# Patient Record
Sex: Female | Born: 1955 | Race: White | Hispanic: No | Marital: Married | Smoking: Never smoker
Health system: Southern US, Community
[De-identification: ages and names within clinical notes are randomized; demographics above are authoritative.]

## PROBLEM LIST (undated history)

## (undated) DIAGNOSIS — T783XXA Angioneurotic edema, initial encounter: Secondary | ICD-10-CM

## (undated) DIAGNOSIS — K900 Celiac disease: Secondary | ICD-10-CM

## (undated) DIAGNOSIS — K219 Gastro-esophageal reflux disease without esophagitis: Secondary | ICD-10-CM

## (undated) HISTORY — PX: ABDOMINAL HYSTERECTOMY: SHX81

## (undated) HISTORY — DX: Angioneurotic edema, initial encounter: T78.3XXA

---

## 1998-12-11 ENCOUNTER — Other Ambulatory Visit: Admission: RE | Admit: 1998-12-11 | Discharge: 1998-12-11 | Payer: Self-pay | Admitting: Gynecology

## 2000-03-16 ENCOUNTER — Other Ambulatory Visit: Admission: RE | Admit: 2000-03-16 | Discharge: 2000-03-16 | Payer: Self-pay | Admitting: Gynecology

## 2000-09-12 ENCOUNTER — Inpatient Hospital Stay (HOSPITAL_COMMUNITY): Admission: RE | Admit: 2000-09-12 | Discharge: 2000-09-14 | Payer: Self-pay | Admitting: Gynecology

## 2000-09-12 ENCOUNTER — Encounter (INDEPENDENT_AMBULATORY_CARE_PROVIDER_SITE_OTHER): Payer: Self-pay | Admitting: Specialist

## 2001-10-12 ENCOUNTER — Other Ambulatory Visit: Admission: RE | Admit: 2001-10-12 | Discharge: 2001-10-12 | Payer: Self-pay | Admitting: Gynecology

## 2004-04-15 ENCOUNTER — Other Ambulatory Visit: Admission: RE | Admit: 2004-04-15 | Discharge: 2004-04-15 | Payer: Self-pay | Admitting: Obstetrics and Gynecology

## 2006-01-05 ENCOUNTER — Ambulatory Visit (HOSPITAL_COMMUNITY): Admission: RE | Admit: 2006-01-05 | Discharge: 2006-01-05 | Payer: Self-pay | Admitting: Family Medicine

## 2006-03-16 ENCOUNTER — Emergency Department (HOSPITAL_COMMUNITY): Admission: EM | Admit: 2006-03-16 | Discharge: 2006-03-16 | Payer: Self-pay | Admitting: Emergency Medicine

## 2006-03-29 ENCOUNTER — Encounter (HOSPITAL_COMMUNITY): Admission: RE | Admit: 2006-03-29 | Discharge: 2006-04-03 | Payer: Self-pay | Admitting: Family Medicine

## 2006-03-31 ENCOUNTER — Ambulatory Visit: Payer: Self-pay | Admitting: Gastroenterology

## 2008-09-03 ENCOUNTER — Ambulatory Visit (HOSPITAL_COMMUNITY): Admission: RE | Admit: 2008-09-03 | Discharge: 2008-09-03 | Payer: Self-pay | Admitting: Family Medicine

## 2010-04-24 ENCOUNTER — Encounter: Payer: Self-pay | Admitting: Family Medicine

## 2010-05-04 ENCOUNTER — Other Ambulatory Visit: Payer: Self-pay | Admitting: Obstetrics and Gynecology

## 2010-05-04 DIAGNOSIS — R928 Other abnormal and inconclusive findings on diagnostic imaging of breast: Secondary | ICD-10-CM

## 2010-05-05 ENCOUNTER — Encounter: Payer: Self-pay | Admitting: Obstetrics and Gynecology

## 2010-05-11 ENCOUNTER — Ambulatory Visit
Admission: RE | Admit: 2010-05-11 | Discharge: 2010-05-11 | Disposition: A | Discharge status: Home / Self Care | Payer: BC Managed Care – PPO | Source: Ambulatory Visit | Attending: Obstetrics and Gynecology | Admitting: Obstetrics and Gynecology

## 2010-05-11 DIAGNOSIS — R928 Other abnormal and inconclusive findings on diagnostic imaging of breast: Secondary | ICD-10-CM

## 2010-08-20 NOTE — Consult Note (Signed)
NAME:  Rhonda Sullivan                ACCOUNT NO.:  [id number]   MEDICAL RECORD NO.:  [id number]           PATIENT TYPE:   LOCATION:                                FACILITY:  [facility]   PHYSICIAN:  Caro Hight, M.D.      DATE OF BIRTH:  Jul 02, 1955   DATE OF CONSULTATION:  03/31/2006  DATE OF DISCHARGE:                                 CONSULTATION   REASON FOR CONSULTATION:  Reflux and abdominal pain.   HPI:  Rhonda Sullivan is a 55 year old female who reports having bad  indigestion day and night that began approximately two months ago.  She  states that two weeks ago, she ate a sandwich from [employer] and she  was doubled over in pain and even ice water caused her to have abdominal  pain and indigestion.  She saw her doctor because of chest pain, as  well.  She was also seen and evaluated in the emergency department.  She  reports having a CT scan, an echocardiogram, and a stress test which was  okay.  She reports seeing Dr. Leslye Peer who told her she may have  peptic ulcer disease and may need an upper endoscopy.  I have no records  available to me today.  She was started on over the counter Prilosec  once a day which she took for several days in a row and her symptoms are  better.  She was wondering if this was related to stress from  volunteering at school.  She also complains of epigastric pain.  Her  weight is increased by approximately 30 pounds in the last year.  She  has gained 10 pounds in the last six months.  She also complains of  increasing pain in her upper extremities over the last six months.  She  started out with pain behind her left knee.  She now has so much pain in  her left leg and in her right leg when she bends over that she is unable  to clip her toenails.  She also has pain in her upper arms and in her  chest, as well.  She also has the inability to open jars and this is  new.  Her husband is unable to lay his arm across her chest because of  pain and it is too  uncomfortable.  She is still able to walk.  She is  still able to get out of a chair.  She denies any difficulty swallowing.  She does not use aspirin, BC, or Goody's.  She rarely uses Aleve or  ibuprofen.  She denies any blood in her stool or black stool.  She had  nausea with pain in her chest which is now resolved.  She denies any  vomiting.  She had a partial hysterectomy and because of that,  occasionally she has episodes in which she has to wear pads in her  underwear due to fecal seepage and having small bowel movements.  During  these episodes, she has no awareness that she is having a bowel  movement.  She has never  had a colonoscopy.   PAST MEDICAL HISTORY:  Per the HPI.   PAST SURGICAL HISTORY:  Per the HPI.   ALLERGIES:  No known drug allergies.   MEDICATIONS:  1. Omeprazole 20 mg a day as needed.  2. Tums 2 day as needed.   FAMILY HISTORY:  Her maternal grandmother had ovarian cancer.  She has  no family history of breast or ovarian cancer, colon cancer or colon  polyps.   SOCIAL HISTORY:  She is married and has three children.  She is a full  time Psychologist, occupational.  She denies any tobacco or alcohol use.   REVIEW OF SYSTEMS:  Per the HPI, otherwise, all systems are negative.   PHYSICAL EXAMINATION:  VITAL SIGNS:  Weight 160 pounds, height 5 feet 3  inches, BMI 28.3 (overweight) temperature 98.2, blood pressure 122/68,  pulse 78.  GENERAL:  She is in no apparent distress, alert and oriented  x4.  HEENT:  Atraumatic, normocephalic.  Pupils equal and react to  light.  Mouth no oral lesions.  Posterior pharynx without erythema or  exudate.  NECK:  Full range of motion and no lymphadenopathy.  LUNGS:  Clear to auscultation bilaterally.  CARDIOVASCULAR:  Shows regular  rhythm, no murmur, normal S1 and S2.  ABDOMEN:  Bowel sounds are  present, soft, nontender, nondistended.  No rebound or guarding. No  splenomegaly.  No abdominal bruits.  No pulsatile masses.  EXTREMITIES:   Without cyanosis, clubbing or edema.  NEUROLOGICAL:  She has no focal neurologic deficits on gross  examination.   LABORATORY DATA:  In December 2007, white count 8.1, hemoglobin 15.2,  platelets 251, BUN 21, creatinine 0.87, total bili 0.6, AST 36, ALT 43,  albumin 4.3, amylase 27, lipase 17.   ASSESSMENT:  Rhonda Sullivan is a 55 year old female with his heartburn,  indigestion, and chest pain which is likely secondary to  gastroesophageal reflux disease.  She has had symptomatic improvement on  over the counter Prilosec once a day.  She is now only using Prilosec  periodically.  She may have gastroesophageal reflux disease as a primary  diagnosis.  With her the profound myalgias and upper extremity weakness,  in the differential diagnosis would include an esophageal motility  disorder secondary to a connective tissue disorder presenting with  gastroesophageal reflux disease as a symptom.  She is average risk for  developing colon cancer.  Thank you for allowing me to see Rhonda Sullivan in  consultation.  My recommendations follow.   RECOMMENDATIONS:  1. I recommend Rhonda Sullivan see Dr. Berna Spare or Donnal Moat, P.A.-C.,      for her upper and lower extremity muscle pain and upper extremity      weakness and be evaluated as soon as possible.  I spoke with Ms.      Sullivan and explained this to her.  I also spoke with Donnal Moat,      P.A.-C., today.  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  2. She should continue the Prilosec twice daily for two weeks and then      once daily until her weight is 130 to 140 pounds.  She is given the      [facility] GERD self-care handout and asked to follow the      recommendations.  3. If her  chest pain and nausea are not completely resolved in one     month, then I will schedule her endoscopy      prior to March 2008.   Otherwise, her an EGD and colonoscopy will be      scheduled for [id number].  If her symptoms are completely      resolved by March 2008, then only colonoscopy will be performed.  A      follow-up appointment will be scheduled after her endoscopies have      been completed.      Caro Hight, M.D.  Electronically Signed     SM/MEDQ  D:  03/31/2006  T:  03/31/2006  Job:  [id number]   cc:   Donnal Moat, PA   Bonne Dolores, M.D.  Fax: [phone]   Leslye Peer, MD  Fax: [phone]

## 2010-08-20 NOTE — Op Note (Signed)
[facility]  Patient:    Rhonda Sullivan, Rhonda Sullivan                    MRN: [id number] Proc. Date: 09/12/00 Adm. Date:  [id number] Attending:  Oswaldo Done                           Operative Report  PREOPERATIVE DIAGNOSES: 1. Pelvic organ prolapse with grade 3 cystocele, enterocele, rectocele and    grade 3 uterine descensus. 2. Stress urinary incontinence.  POSTOPERATIVE DIAGNOSES: 1. Pelvic organ prolapse with grade 3 cystocele, enterocele, rectocele and    grade 3 uterine descensus. 2. Stress urinary incontinence.  PROCEDURES: 1. Abdominal hysterectomy. 2. Cardinal uterosacral complex vaginal vault suspension. 3. Halban culdoplasty. 4. Paravaginal repair. 5. Burch procedure. 6. Posterior and enterocele repairs.  SURGEON:  Selinda Orion, M.D.  ASSISTANT:  Dallie Dad, M.D.  ANESTHESIA:  Orotracheal.  DESCRIPTION OF PROCEDURE:  Under excellent general anesthesia with the patient prepped and draped in the [place] with a three-way 15 cc Foley in her bladder, a Pfannenstiel incision was made and extended through the fascia. The peritoneum was then opened and the abdomen explored.  There were no abnormalities identified in the upper abdomen.  Both ovaries appeared normal. The uterus was elevated by Allis clamps placed across the adnexal pedicles. The round ligament was then sutured and ligated then transected.  The anterior leaf of the broad ligament was then opened so as to push the peritoneum and bladder off the lower uterine segment.  The ovarian vessels were then skeletonized, clamped, cut, sutured and tied with 0 Vicryl.  A second free tie was used to doubly ligated the ovarian ligament.  At this point, the uterine vessels were skeletonized, clamped, cut, sutured and tied with 0 Vicryl.  The cardinal and uterosacrals were then progressively clamped, cut, sutured and tied with 0 Vicryl.  At this point, the cervix was excised and  the vaginal cuff closed with a running suture of 0 Vicryl.  At this point, attention was turned to the central fascial defect at the apex of the vaginal cuff.  The defect was identified and the bladder dissected off the defect.  The defect was then plicated with a running suture of 2-0 Vicryl.  At this point, the peritoneum was removed from the cul-de-sac so as to remove the enterocele sac. At this point, a Halban culdoplasty was performed so as to secure the perirectal fascia to the vaginal apex at the cuff with a series of interrupted sutures.  The transverse fascial defect of the perirectal fascia was thus approximated to the cuff to provide a complete fascial envelope.  The cardinal-uterosacral suspension or complex suspension sutures were then placed from posterior uterosacral ligament to vaginal cuff including the uterosacral and cardinal ligaments and then secured to the anterior vaginal fascia.  The cuff was thus secured by cardinal-uterosacral complex culposuspension.  The material used was 0 Ethibond.  At this point, the pelvic floor was reperitonealized with a running suture of 2-0 Monocryl.  The abdominal peritoneum was then closed with a running suture of 0 Monocryl.  At this point, attention was turned to the paravaginal portion of the procedure.  The retropubic space was dissected and the adventitial tissue cleared as well as so as to expose the white line fascia of the pelvis and the area of separation of the paracervical tissues.  At this point, a key  suture was placed lateral to the vesical neck so as to elevate the urethra underneath the symphysis. The paravaginal defect was then corrected with interrupted suture of 0 Ethibond approximately 1 cm apart from the ischial spine to the retropubic attachments.  At this point, Burch sutures were placed on each side so as to elevate the entire anterior vaginal wall into a high intra-abdominal position. Three sutures were placed  on the right and two on the left.  Again, 0 Ethibond was used.  At this point, with the cystocele completely reduced and the tissue secured to the areas from which it had been separated, the procedure was terminated and the pelvis irrigated.  The rectus muscles were then plicated in the midline with a running suture of 0 Monocryl.  The fascia was then approximated with interrupted sutures of 0 Vicryl from each angle to the midline.  The subcutaneous tissues were approximated with interrupted sutures of 3-0 Vicryl and the skin closed with skin staples and Steri-Strips.  At this point, attention was turned to the posterior repair and enterocele repair. The patient was reprepped and draped for the vaginal portion of the procedure.  At this point, the posterior vaginal mucosa was incised and dissected from the perirectal fascia so as to completely expose the perirectal fascial defects.  The transverse fascial tear had been previously repaired from above.  The central fenestration and weakness of the fascia was then plicated with a running suture of 2-0 Vicryl.  The detachment of the  perineal body from the levator group was then corrected with interrupted sutures of 2-0 Vicryl. Next, the mucosa was trimmed and the upper layers of endopelvic fascia closed as a single running suture from the apex of the cuff to the introitus.  Once the repairs were complete, the bladder was filled with approximately 400 cc of lactate Ringers and a Bonnano suprapubic cystocath inserted and secured with 0 Ethibond.  At the end of the procedure, sponge and lap counts were correct. There were no complications.  The patient returned to the recovery room in excellent condition. DD:  09/12/00 TD:  09/13/00 Job: [id number]

## 2010-08-20 NOTE — H&P (Signed)
[facility]  Patient:    Rhonda Sullivan, Rhonda Sullivan                      MRN: [id number] Adm. Date:  09/11/00 Attending:  Selinda Orion, M.D. CC:         Dr. Rexene Alberts, N.C.   History and Physical  CHIEF COMPLAINT:  Pelvic support problems with incontinence.  HISTORY OF PRESENT ILLNESS:  A 55 year old white married, gravida 4, para 3, AB 1 with a history of very difficult third delivery now with progressive difficulty with urinary incontinence and bulge of her vaginal wall through the introitus. She only occasionally wears pads but often has to change her clothing. The bulge of her vaginal wall through the introitus has become more pronounced in the last year or so. She also has discomfort when she stands for a prolonged period of time. She also has recently had a low grade abnormal genital cytology resolved on most recent Pap after colpo directed biopsies. She has had workup that revealed a very large bladder capacity but adequate emptying. She has a markedly positive Q-tip test with a rotation of 90 degrees. Post void she has evidence of central fascial weakness. She also has bilateral paravaginal defects. She has central and transverse posterior vaginal wall defects with enterocele and rectocele. Her anus and ______, but she has significant detachment of the perineal body. I have discussed with her alternative forms of therapy including urologic procedures, GYN procedures, no therapy, and pessary. She understands all and is now admitted for definitive therapy by abdominal hysterectomy, paravaginal plus Burch, posterior and enterocele repairs and vaginal vault colposuspension.  PAST MEDICAL HISTORY:  Usual childhood disease without sequelae.  MEDICAL ILLNESSES:  Chronic constipation and GERD. She also has a history of uterine leiomyomata without significant increase in size over the years. Previous hospitalizations for reflux in 1973 and for  vaginal delivery x 3.  FAMILY HISTORY:  Father died age 52 of emphysema, cardiovascular disease, and diabetes. Mother has significant arthritis and GI problems. Sister and three brothers living and well.  HABITS:  Denies ethanol or tobacco use.  SOCIAL HISTORY:  A Pharmacist, hospital in [place], [place]. Her children are grown, her youngest still living at home.  REVIEW OF SYSTEMS:  HEENT:  Denies symptoms. CARDIORESPIRATORY:  Denies asthma, cough, bronchitis, shortness of breath. GI/GU: Denies frequency, urgency, dysuria, change in bowel habits or food intolerance. Urologic problems as above. Pelvic support problems as outlined.  PHYSICAL EXAMINATION:  GENERAL:  Well-developed, well-nourished, white female, moderately over ideal weight.  HEENT:  Pupils equal round and reactive to light and accommodate. Fundi not examined. Oropharynx clear.  NECK:  Supple otherwise mass or thyroid enlargement.  BREASTS:  Soft without mass, nodes, nipple discharge.  HEART:  Regular rhythm without murmur or cardiac enlargement.  ABDOMEN:  Soft, scaphoid without mass or organomegaly.  PELVIC:  External genitalia normal female. Vagina poor anterior vaginal wall support with bulge through the introitus with straining.  GI:  Bilateral paravaginal defects and central fascial defect with smooth mucosa at the apex of the cuff. She has uterine descensus grade 2 and extremely poor posterior pelvic support with central and transverse fascial weakness. She has perineal body detachment. Her uterus is approximately eight week size, adnexa clear. Rectovaginal confirms.  EXTREMITIES:  Negative.  NEUROLOGIC:  Physiologic.  IMPRESSION:  Pelvic support problems with central and paravaginal defects, grade 1-2 uterine descensus, rectocele and enterocele with detachment of the perineal body.  PLAN:  Abdominal hysterectomy paravaginal plus Burch, colposuspension, posterior and enterocele repairs. DD:   09/11/00 TD:  09/12/00 Job: [id number]

## 2010-08-20 NOTE — Discharge Summary (Signed)
[facility]  Patient:    Rhonda Sullivan, Rhonda Sullivan                  MRN: [id number] Adm. Date:  09/12/00 Disc. Date: 09/14/00 Attending:  Selinda Orion, M.D.                           Discharge Summary  HISTORY OF PRESENT ILLNESS:  Ms. Nierman is a 55 year old white married female, gravida 36, para 3, AB 1, who presents to the office with difficulty of urinary incontinence and bulge of her vaginal wall through the introitus.  It is noted that she had a very difficult third delivery.  She occasionally wears pads but often has to change clothing.  The bulge at the vaginal wall comes through the introitus and has become more pronounced over the last year.  She also has discomfort when she stands for long periods of time.  It is noted that she recently had a low-grade abnormal genital cytology resolved on most recent Paps after colposcopy-directed biopsies.  Workup has revealed a very large bladder capacity but adequate emptying.  She has a markedly positive Q-tip test with rotation of 90 degrees.  Post void, she has evidence of central fascial weakness.  There are also bilateral paravaginal defects.  She has central and transverse posterior vaginal wall defects with enterocele and rectocele.  She has significant detachment of the peritoneal body.  She is now admitted for definitive therapy.  ADMISSION EXAM:  CHEST:  Clear to A&P.  HEART:  Rate and rhythm regular without murmur, gallop, or cardiac enlargement.  ABDOMEN:  Soft and scaphoid without masses or organomegaly.  PELVIC:  External genitalia within normal limits for female.  Vagina:  Poor anterior vaginal wall support with bulge through the introitus with straining. She has bilateral paravaginal defects and central fascial defects with smooth muscle at the apex of the cuff.  There was uterine descensus, grade 2, and extremely poor posterior pelvic support with central and transverse  fascial weakness.  There is detachment of the perineal body.  The uterus is approximately 8 weeks size, adnexa bilaterally clear.  Rectovaginal exam confirms.  IMPRESSION:  Pelvic support problems with central and paravaginal defects, grade 1 to 2, uterine descensus, rectocele and enterocele with detachment of the perineal body.  PLAN:  Abdominal hysterectomy, paravaginal, plus Birch procedure, colposuspension, posterior and enterocele repairs under general anesthesia. Risks and benefits were discussed with the patient and she accepts these procedures.  LABORATORY DATA:  Admission hemoglobin 14.6, hematocrit 43.5.  On the first postoperative day, hemoglobin was 11.1, hematocrit 42.9.  PROCEDURES PERFORMED: 1. Abdominal hysterectomy. 2. Cardinal uterosacral complex vaginal vault suspension. 3. Halban culdoplasty. 4. Paravaginal repair. 5. Wendee Copp procedure. 6. Posterior and enterocele repairs under general anesthesia.  HOSPITAL COURSE:  The procedures were completed without any complications and the patient was returned to the recovery room in excellent condition. Pathology report revealed simple hyperplasia without atypia of the endometrium, adenomyosis, and leiomyomata intramural.  Her postoperative course was without complications and she was discharged on the second postoperative day in excellent condition.  Final discharge instructions included no heavy lifting or straining, no vaginal entrance, and increased ambulation as tolerated.  She is to call for any fever of over 100.5 or failure of daily improvement.  Diet regular.  MEDICATIONS: 1. Bextra 20 mg one p.o. daily. 2. Tylox one p.o. q.2-4h. p.r.n. discomfort.  She is also to resume medications  taken prior to surgery as prescribed.  FOLLOW-UP:  She is to return to the office in one week for follow-up.  CONDITION ON DISCHARGE:  Excellent.  FINAL DISCHARGE DIAGNOSES: 1. Pelvic organ prolapse with grade 3 cystocele,  enterocele, rectocele, and    grade 3 uterine descensus. 2. Stress urinary incontinence.  PROCEDURES PERFORMED: 1. Abdominal hysterectomy. 2. Cardinal uterosacral complex vaginal vault suspension. 3. Halban coldoplasty. 4. Paravaginal repair. 5. Wendee Copp procedure. 6. Posterior and enterocele repairs under general anesthesia. DD:  09/22/00 TD:  09/24/00 Job: [id number]

## 2016-02-28 ENCOUNTER — Encounter (HOSPITAL_COMMUNITY): Payer: Self-pay | Admitting: *Deleted

## 2016-02-28 ENCOUNTER — Emergency Department (HOSPITAL_COMMUNITY)
Admit: 2016-02-28 | Discharge: 2016-02-28 | Disposition: A | Discharge status: Home / Self Care | Payer: BLUE CROSS/BLUE SHIELD | Attending: Emergency Medicine | Admitting: Emergency Medicine

## 2016-02-28 ENCOUNTER — Emergency Department (HOSPITAL_COMMUNITY)
Admission: EM | Admit: 2016-02-28 | Discharge: 2016-02-28 | Disposition: A | Discharge status: Home / Self Care | Payer: BLUE CROSS/BLUE SHIELD | Attending: Emergency Medicine | Admitting: Emergency Medicine

## 2016-02-28 DIAGNOSIS — R110 Nausea: Secondary | ICD-10-CM | POA: Diagnosis not present

## 2016-02-28 DIAGNOSIS — R109 Unspecified abdominal pain: Secondary | ICD-10-CM

## 2016-02-28 DIAGNOSIS — R1011 Right upper quadrant pain: Secondary | ICD-10-CM | POA: Insufficient documentation

## 2016-02-28 LAB — CBC WITH DIFFERENTIAL/PLATELET
Basophils Absolute: 0 10*3/uL (ref 0.0–0.1)
Basophils Relative: 1 %
EOS ABS: 0.5 10*3/uL (ref 0.0–0.7)
EOS PCT: 7 %
HCT: 44.4 % (ref 36.0–46.0)
Hemoglobin: 15 g/dL (ref 12.0–15.0)
LYMPHS ABS: 1.9 10*3/uL (ref 0.7–4.0)
LYMPHS PCT: 26 %
MCH: 29.6 pg (ref 26.0–34.0)
MCHC: 33.8 g/dL (ref 30.0–36.0)
MCV: 87.7 fL (ref 78.0–100.0)
MONO ABS: 0.8 10*3/uL (ref 0.1–1.0)
MONOS PCT: 11 %
Neutro Abs: 4.1 10*3/uL (ref 1.7–7.7)
Neutrophils Relative %: 55 %
PLATELETS: 189 10*3/uL (ref 150–400)
RBC: 5.06 MIL/uL (ref 3.87–5.11)
RDW: 14.5 % (ref 11.5–15.5)
WBC: 7.4 10*3/uL (ref 4.0–10.5)

## 2016-02-28 LAB — URINALYSIS, ROUTINE W REFLEX MICROSCOPIC
BILIRUBIN URINE: NEGATIVE
GLUCOSE, UA: NEGATIVE mg/dL
HGB URINE DIPSTICK: NEGATIVE
KETONES UR: 40 mg/dL — AB
Nitrite: NEGATIVE
PH: 6 (ref 5.0–8.0)
PROTEIN: NEGATIVE mg/dL
Specific Gravity, Urine: 1.01 (ref 1.005–1.030)

## 2016-02-28 LAB — URINE MICROSCOPIC-ADD ON
Bacteria, UA: NONE SEEN
RBC / HPF: NONE SEEN RBC/hpf (ref 0–5)

## 2016-02-28 LAB — COMPREHENSIVE METABOLIC PANEL
ALBUMIN: 4.3 g/dL (ref 3.5–5.0)
ALT: 34 U/L (ref 14–54)
AST: 38 U/L (ref 15–41)
Alkaline Phosphatase: 47 U/L (ref 38–126)
Anion gap: 10 (ref 5–15)
BUN: 16 mg/dL (ref 6–20)
CHLORIDE: 108 mmol/L (ref 101–111)
CO2: 22 mmol/L (ref 22–32)
CREATININE: 0.77 mg/dL (ref 0.44–1.00)
Calcium: 10 mg/dL (ref 8.9–10.3)
GFR calc Af Amer: >60 mL/min (ref >60)
GLUCOSE: 109 mg/dL — AB (ref 65–99)
POTASSIUM: 3.3 mmol/L — AB (ref 3.5–5.1)
Sodium: 140 mmol/L (ref 135–145)
Total Bilirubin: 0.8 mg/dL (ref 0.3–1.2)
Total Protein: 6.8 g/dL (ref 6.5–8.1)

## 2016-02-28 LAB — LIPASE, BLOOD: LIPASE: 26 U/L (ref 11–51)

## 2016-02-28 MED ORDER — SODIUM CHLORIDE 0.9 % IV BOLUS (SEPSIS)
1000.0000 mL | Freq: Once | INTRAVENOUS | Status: AC
Start: 2016-02-28 — End: 2016-02-28
  Administered 2016-02-28: 1000 mL via INTRAVENOUS

## 2016-02-28 MED ORDER — FAMOTIDINE 20 MG PO TABS: 20.0000 mg | ORAL_TABLET | Freq: Two times a day (BID) | ORAL | 0 refills | Status: DC

## 2016-02-28 MED ORDER — FENTANYL CITRATE (PF) 100 MCG/2ML IJ SOLN
50.0000 ug | Freq: Once | INTRAMUSCULAR | Status: AC
  Administered 2016-02-28: 50 ug via INTRAVENOUS
  Filled 2016-02-28: qty 2

## 2016-02-28 MED ORDER — ONDANSETRON HCL 4 MG PO TABS: 4.0000 mg | ORAL_TABLET | Freq: Three times a day (TID) | ORAL | 0 refills | Status: DC | PRN

## 2016-02-28 MED ORDER — ONDANSETRON HCL 4 MG/2ML IJ SOLN
4.0000 mg | Freq: Once | INTRAMUSCULAR | Status: AC
  Administered 2016-02-28: 4 mg via INTRAVENOUS
  Filled 2016-02-28: qty 2

## 2016-02-28 MED ORDER — HYDROCODONE-ACETAMINOPHEN 5-325 MG PO TABS: ORAL_TABLET | ORAL | 0 refills | Status: DC

## 2016-02-28 NOTE — ED Provider Notes (Signed)
[facility] DEPT Provider Note   CSN: [id number] Arrival date & time: 02/28/16  0232     History   Chief Complaint Chief Complaint  Patient presents with  . Abdominal Pain    HPI Rhonda Sullivan is a 60 y.o. female.  The history is provided by the patient and the spouse.  Abdominal Pain   This is a new problem. The current episode started yesterday. The problem has been gradually worsening. The pain is located in the RUQ. The pain is severe. Associated symptoms include nausea. Pertinent negatives include fever, vomiting and dysuria. The symptoms are aggravated by certain positions and palpation. Nothing relieves the symptoms.  patient reports several days ago she began having pain in back/scapular region of unknown etiology Then, yesterday she had episodes of RUQ abd pain with some radiation into her back.  She has nausea.  The pain is intermittent and severe at times She has never had this before No CP reported    PMH - none Soc hx - no ETOH use Surg hx - none OB History    No data available       Home Medications    Prior to Admission medications   Not on File    Family History No family history on file.  Social History Social History  Substance Use Topics  . Smoking status: Never Smoker  . Smokeless tobacco: Never Used  . Alcohol use No     Allergies   Other   Review of Systems Review of Systems  Constitutional: Negative for fever.  Cardiovascular: Negative for chest pain.  Gastrointestinal: Positive for abdominal pain and nausea. Negative for vomiting.  Genitourinary: Negative for dysuria.  All other systems reviewed and are negative.    Physical Exam Updated Vital Signs BP 120/74   Pulse 67   Temp 97.4 F (36.3 C) (Oral)   Resp 15   Ht 5' 2"  (1.575 m)   Wt 66.2 kg   SpO2 97%   BMI 26.70 kg/m   Physical Exam CONSTITUTIONAL: Well developed/well nourished, uncomfortable appearing HEAD: Normocephalic/atraumatic EYES: EOMI/PERRL,  no icterus ENMT: Mucous membranes moist NECK: supple no meningeal signs SPINE/BACK:entire spine nontender, parathoracic tenderness noted, no bruising noted CV: S1/S2 noted, no murmurs/rubs/gallops noted LUNGS: Lungs are clear to auscultation bilaterally, no apparent distress ABDOMEN: soft, moderate RUQ tenderness, no rebound or guarding, bowel sounds noted throughout abdomen GU:no cva tenderness NEURO: Pt is awake/alert/appropriate, moves all extremitiesx4.  No facial droop.   EXTREMITIES: pulses normal/equal, full ROM SKIN: warm, color normal PSYCH: no abnormalities of mood noted, alert and oriented to situation   ED Treatments / Results  Labs (all labs ordered are listed, but only abnormal results are displayed) Labs Reviewed  COMPREHENSIVE METABOLIC PANEL - Abnormal; Notable for the following:       Result Value   Potassium 3.3 (*)    Glucose, Bld 109 (*)    All other components within normal limits  URINALYSIS, ROUTINE W REFLEX MICROSCOPIC (NOT AT [facility]) - Abnormal; Notable for the following:    Ketones, ur 40 (*)    Leukocytes, UA TRACE (*)    All other components within normal limits  URINE MICROSCOPIC-ADD ON - Abnormal; Notable for the following:    Squamous Epithelial / LPF 0-5 (*)    All other components within normal limits  CBC WITH DIFFERENTIAL/PLATELET  LIPASE, BLOOD    EKG Interpretation  Date/Time:  Sunday February 28 2016 03:51:29 EST Ventricular Rate:  66 PR Interval:  QRS Duration: 136 QT Interval:  431 QTC Calculation: 452 R Axis:   93 Text Interpretation:  Sinus rhythm RBBB and LPFB Confirmed by Christy Gentles  MD, Elenore Rota ([id number]) on 02/28/2016 4:24:29 AM        Radiology No results found.  Procedures Procedures (including critical care time)  Medications Ordered in ED Medications  fentaNYL (SUBLIMAZE) injection 50 mcg (not administered)  ondansetron (ZOFRAN) injection 4 mg (4 mg Intravenous Given 02/28/16 0442)  fentaNYL (SUBLIMAZE) injection  50 mcg (50 mcg Intravenous Given 02/28/16 0442)  sodium chloride 0.9 % bolus 1,000 mL (1,000 mLs Intravenous New Bag/Given 02/28/16 0500)     Initial Impression / Assessment and Plan / ED Course  I have reviewed the triage vital signs and the nursing notes.  Pertinent labs  results that were available during my care of the patient were reviewed by me and considered in my medical decision making (see chart for details).  Clinical Course     5:44 AM Plan to obtain RUQ ultrasound due to persistent abdominal pain No other focal ABD tenderness at this time 7:27 AM Plan at signout to dr mcmanus to f/u on US imaging   Final Clinical Impressions(s) / ED Diagnoses   Final diagnoses:  RUQ pain  RUQ abdominal pain    New Prescriptions New Prescriptions   No medications on file     Ripley Fraise, MD 02/28/16 [phone]

## 2016-02-28 NOTE — ED Notes (Signed)
Pt returned from xray

## 2016-02-28 NOTE — ED Notes (Signed)
Pt transported to US

## 2016-02-28 NOTE — ED Notes (Signed)
EDP at bedside updating patient and family.

## 2016-02-28 NOTE — Discharge Instructions (Signed)
Eat a bland diet, avoiding greasy, fatty, fried foods, as well as spicy and acidic foods or beverages.  Avoid eating within the hour or 2 before going to bed or laying down.  Also avoid teas, colas, coffee, chocolate, pepermint and spearment.  Take over the counter maalox/mylanta, as directed on packaging, as needed for discomfort.  Take the prescriptions as directed.  Call your regular medical doctor and the GI doctor tomorrow to schedule a follow up appointment this week.  Return to the Emergency Department immediately if worsening.

## 2016-02-28 NOTE — ED Triage Notes (Signed)
Pt c/o epigastric abd pain that radiates to back area and pain in right shoulder blade for the past few days, denies any n/v/d,

## 2016-02-28 NOTE — ED Notes (Signed)
IV started, labs drawn. Pt offered prescribed pain med at this time, states her pain is "better" currently that it "comes and goes" and she wants to withhold taking pain medication as long as possible. Informed patient if pain worsens to inform nurse, so pain med can be admin

## 2016-02-28 NOTE — ED Provider Notes (Signed)
Pt received at sign out with [place] RUQ pending. [place] and labs as below. Workup reassuring. EPIC chart reviewed: pt with previous US RUQ and HIDA scan 2007 due to GERD and abd pain and Endoscopy was recommended. Tx symptomatically, f/u GI MD. Dx and testing d/w pt and family.  Questions answered.  Verb understanding, agreeable to d/c home with outpt f/u.    Results for orders placed or performed during the hospital encounter of 02/28/16  Comprehensive metabolic panel  Result Value Ref Range   Sodium 140 135 - 145 mmol/L   Potassium 3.3 (L) 3.5 - 5.1 mmol/L   Chloride 108 101 - 111 mmol/L   CO2 22 22 - 32 mmol/L   Glucose, Bld 109 (H) 65 - 99 mg/dL   BUN 16 6 - 20 mg/dL   Creatinine, Ser 0.77 0.44 - 1.00 mg/dL   Calcium 10.0 8.9 - 10.3 mg/dL   Total Protein 6.8 6.5 - 8.1 g/dL   Albumin 4.3 3.5 - 5.0 g/dL   AST 38 15 - 41 U/L   ALT 34 14 - 54 U/L   Alkaline Phosphatase 47 38 - 126 U/L   Total Bilirubin 0.8 0.3 - 1.2 mg/dL   GFR calc non Af Amer >60 >60 mL/min   GFR calc Af Amer >60 >60 mL/min   Anion gap 10 5 - 15  CBC with Differential/Platelet  Result Value Ref Range   WBC 7.4 4.0 - 10.5 K/uL   RBC 5.06 3.87 - 5.11 MIL/uL   Hemoglobin 15.0 12.0 - 15.0 g/dL   HCT 44.4 36.0 - 46.0 %   MCV 87.7 78.0 - 100.0 fL   MCH 29.6 26.0 - 34.0 pg   MCHC 33.8 30.0 - 36.0 g/dL   RDW 14.5 11.5 - 15.5 %   Platelets 189 150 - 400 K/uL   Neutrophils Relative % 55 %   Neutro Abs 4.1 1.7 - 7.7 K/uL   Lymphocytes Relative 26 %   Lymphs Abs 1.9 0.7 - 4.0 K/uL   Monocytes Relative 11 %   Monocytes Absolute 0.8 0.1 - 1.0 K/uL   Eosinophils Relative 7 %   Eosinophils Absolute 0.5 0.0 - 0.7 K/uL   Basophils Relative 1 %   Basophils Absolute 0.0 0.0 - 0.1 K/uL  Lipase, blood  Result Value Ref Range   Lipase 26 11 - 51 U/L  Urinalysis, Routine w reflex microscopic (not at [facility])  Result Value Ref Range   Color, Urine YELLOW YELLOW   APPearance CLEAR CLEAR   Specific Gravity, Urine 1.010 1.005 - 1.030    pH 6.0 5.0 - 8.0   Glucose, UA NEGATIVE NEGATIVE mg/dL   Hgb urine dipstick NEGATIVE NEGATIVE   Bilirubin Urine NEGATIVE NEGATIVE   Ketones, ur 40 (A) NEGATIVE mg/dL   Protein, ur NEGATIVE NEGATIVE mg/dL   Nitrite NEGATIVE NEGATIVE   Leukocytes, UA TRACE (A) NEGATIVE  Urine microscopic-add on  Result Value Ref Range   Squamous Epithelial / LPF 0-5 (A) NONE SEEN   WBC, UA 0-5 0 - 5 WBC/hpf   RBC / HPF NONE SEEN 0 - 5 RBC/hpf   Bacteria, UA NONE SEEN NONE SEEN   US Abdomen Limited Ruq Result Date: 02/28/2016 CLINICAL DATA:  60 year old female with epigastric and right upper quadrant abdominal pain for 4 days. Initial encounter. EXAM: US ABDOMEN LIMITED - RIGHT UPPER QUADRANT COMPARISON:  Abdomen ultrasound 03/16/2006 FINDINGS: Gallbladder: Chronic 5 mm gallbladder polyp along the lower body (images 10, 19) is unchanged since 2007.  Gallbladder wall thickness elsewhere is normal at 2 mm. No gallstones or sludge identified. No sonographic Murphy sign elicited. No pericholecystic fluid. Common bile duct: Diameter: 5 mm, normal Liver: Diffusely increased liver echogenicity (image 64) is progressed since 2007. No discrete liver lesion or intrahepatic biliary ductal dilatation. Other findings: Negative visible right kidney. IMPRESSION: 1. Fatty liver disease, progressed since 2007. 2. Negative gallbladder; a 5 mm polyp is stable since 2007 and inconsequential. No cholelithiasis. 3. No evidence of acute biliary obstruction. Electronically Signed   By: Genevie Ann M.D.   On: 02/28/2016 08:48      Francine Graven, DO 02/28/16 [phone]

## 2016-02-28 NOTE — ED Notes (Signed)
Pt c/o increased pain to center of abdomen that radiates to back. EDP notified.

## 2017-11-27 ENCOUNTER — Ambulatory Visit (HOSPITAL_COMMUNITY)
Admission: RE | Admit: 2017-11-27 | Discharge: 2017-11-27 | Disposition: A | Discharge status: Home / Self Care | Payer: BLUE CROSS/BLUE SHIELD | Source: Ambulatory Visit | Attending: Physician Assistant | Admitting: Physician Assistant

## 2017-11-27 ENCOUNTER — Other Ambulatory Visit (HOSPITAL_COMMUNITY): Payer: Self-pay | Admitting: Physician Assistant

## 2017-11-27 DIAGNOSIS — R0602 Shortness of breath: Secondary | ICD-10-CM | POA: Diagnosis present

## 2017-11-27 DIAGNOSIS — J9811 Atelectasis: Secondary | ICD-10-CM | POA: Diagnosis not present

## 2017-11-27 DIAGNOSIS — I2699 Other pulmonary embolism without acute cor pulmonale: Secondary | ICD-10-CM

## 2017-11-27 LAB — POCT I-STAT CREATININE: Creatinine, Ser: 0.9 mg/dL (ref 0.44–1.00)

## 2017-11-27 MED ORDER — IOPAMIDOL (ISOVUE-370) INJECTION 76%
100.0000 mL | Freq: Once | INTRAVENOUS | Status: AC | PRN
  Administered 2017-11-27: 100 mL via INTRAVENOUS

## 2018-04-03 ENCOUNTER — Ambulatory Visit (HOSPITAL_COMMUNITY)
Admission: RE | Admit: 2018-04-03 | Discharge: 2018-04-03 | Disposition: A | Discharge status: Home / Self Care | Payer: BLUE CROSS/BLUE SHIELD | Source: Ambulatory Visit | Attending: Physician Assistant | Admitting: Physician Assistant

## 2018-04-03 ENCOUNTER — Other Ambulatory Visit (HOSPITAL_COMMUNITY): Payer: Self-pay | Admitting: Physician Assistant

## 2018-04-03 DIAGNOSIS — M25522 Pain in left elbow: Secondary | ICD-10-CM | POA: Insufficient documentation

## 2018-10-16 ENCOUNTER — Ambulatory Visit: Payer: Self-pay | Admitting: *Deleted

## 2018-10-16 ENCOUNTER — Other Ambulatory Visit: Payer: Self-pay

## 2018-10-16 ENCOUNTER — Other Ambulatory Visit: Payer: BLUE CROSS/BLUE SHIELD

## 2018-10-16 DIAGNOSIS — Z20822 Contact with and (suspected) exposure to covid-19: Secondary | ICD-10-CM

## 2018-10-16 NOTE — Telephone Encounter (Signed)
Patient is calling to request COVID testing- she reports fatigue, vomiting with diarrhea.Patient lives in area where she does not have to have order for COVID testing and has been instructed by PCP to have testing done. Per protocol for diarrhea- patient does need to call back to PCP to review her symptoms as she is at high risk for dehydration- advised her to call them back.  Reason for Disposition . [1] SEVERE diarrhea (e.g., 7 or more times / day more than normal) AND [2] age > 60 years  Answer Assessment - Initial Assessment Questions 1. DIARRHEA SEVERITY: "How bad is the diarrhea?" "How many extra stools have you had in the past 24 hours than normal?"    - NO DIARRHEA (SCALE 0)   - MILD (SCALE 1-3): Few loose or mushy BMs; increase of 1-3 stools over normal daily number of stools; mild increase in ostomy output.   -  MODERATE (SCALE 4-7): Increase of 4-6 stools daily over normal; moderate increase in ostomy output. * SEVERE (SCALE 8-10; OR 'WORST POSSIBLE'): Increase of 7 or more stools daily over normal; moderate increase in ostomy output; incontinence.     15- constantly going to bathroom 2. ONSET: "When did the diarrhea begin?"      yesterday 3. BM CONSISTENCY: "How loose or watery is the diarrhea?"      watery 4. VOMITING: "Are you also vomiting?" If so, ask: "How many times in the past 24 hours?"      Yes- 1 time at work 5. ABDOMINAL PAIN: "Are you having any abdominal pain?" If yes: "What does it feel like?" (e.g., crampy, dull, intermittent, constant)      Yes, crampy- gassy 6. ABDOMINAL PAIN SEVERITY: If present, ask: "How bad is the pain?"  (e.g., Scale 1-10; mild, moderate, or severe)   - MILD (1-3): doesn't interfere with normal activities, abdomen soft and not tender to touch    - MODERATE (4-7): interferes with normal activities or awakens from sleep, tender to touch    - SEVERE (8-10): excruciating pain, doubled over, unable to do any normal activities       moderate 7.  ORAL INTAKE: If vomiting, "Have you been able to drink liquids?" "How much fluids have you had in the past 24 hours?"     Only drinking- Dr pepper, crackers, 12-16 oz 8. HYDRATION: "Any signs of dehydration?" (e.g., dry mouth [not just dry lips], too weak to stand, dizziness, new weight loss) "When did you last urinate?"     No, last urinated- 3 hours ago 9. 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 ANTIBIOTIC USE: "Are you taking antibiotics now or have you taken antibiotics in the past 2 months?"       no 11. OTHER SYMPTOMS: "Do you have any other symptoms?" (e.g., fever, blood in stool)       Chills , fatigue 12. PREGNANCY: "Is there any chance you are pregnant?" "When was your last menstrual period?"       n/a  Protocols used: DIARRHEA-A-AH

## 2018-10-20 LAB — NOVEL CORONAVIRUS, NAA: SARS-CoV-2, NAA: NOT DETECTED

## 2018-10-23 ENCOUNTER — Encounter: Payer: Self-pay | Admitting: Nurse Practitioner

## 2018-10-23 ENCOUNTER — Telehealth: Payer: Self-pay | Admitting: Family Medicine

## 2018-10-23 NOTE — Telephone Encounter (Signed)
Informed pt of negative covid results.

## 2018-10-26 LAB — POCT STOOL FOR WBC

## 2018-10-28 ENCOUNTER — Encounter (HOSPITAL_COMMUNITY): Payer: Self-pay | Admitting: Emergency Medicine

## 2018-10-28 ENCOUNTER — Emergency Department (HOSPITAL_COMMUNITY): Payer: BC Managed Care – PPO

## 2018-10-28 ENCOUNTER — Emergency Department (HOSPITAL_COMMUNITY)
Admission: EM | Admit: 2018-10-28 | Discharge: 2018-10-29 | Disposition: A | Discharge status: Home / Self Care | Payer: BC Managed Care – PPO | Attending: Emergency Medicine | Admitting: Emergency Medicine

## 2018-10-28 ENCOUNTER — Other Ambulatory Visit: Payer: Self-pay

## 2018-10-28 DIAGNOSIS — R1010 Upper abdominal pain, unspecified: Secondary | ICD-10-CM | POA: Diagnosis present

## 2018-10-28 DIAGNOSIS — R112 Nausea with vomiting, unspecified: Secondary | ICD-10-CM | POA: Diagnosis not present

## 2018-10-28 DIAGNOSIS — R1013 Epigastric pain: Secondary | ICD-10-CM | POA: Diagnosis not present

## 2018-10-28 DIAGNOSIS — R110 Nausea: Secondary | ICD-10-CM

## 2018-10-28 HISTORY — DX: Gastro-esophageal reflux disease without esophagitis: K21.9

## 2018-10-28 LAB — CBC WITH DIFFERENTIAL/PLATELET
Abs Immature Granulocytes: 0.01 10*3/uL (ref 0.00–0.07)
Basophils Absolute: 0.1 10*3/uL (ref 0.0–0.1)
Basophils Relative: 1 %
Eosinophils Absolute: 0.2 10*3/uL (ref 0.0–0.5)
Eosinophils Relative: 2 %
HCT: 47.8 % — ABNORMAL HIGH (ref 36.0–46.0)
Hemoglobin: 15.4 g/dL — ABNORMAL HIGH (ref 12.0–15.0)
Immature Granulocytes: 0 %
Lymphocytes Relative: 34 %
Lymphs Abs: 2.7 10*3/uL (ref 0.7–4.0)
MCH: 28.4 pg (ref 26.0–34.0)
MCHC: 32.2 g/dL (ref 30.0–36.0)
MCV: 88.2 fL (ref 80.0–100.0)
Monocytes Absolute: 0.6 10*3/uL (ref 0.1–1.0)
Monocytes Relative: 7 %
Neutro Abs: 4.3 10*3/uL (ref 1.7–7.7)
Neutrophils Relative %: 56 %
Platelets: 234 10*3/uL (ref 150–400)
RBC: 5.42 MIL/uL — ABNORMAL HIGH (ref 3.87–5.11)
RDW: 12.8 % (ref 11.5–15.5)
WBC: 7.7 10*3/uL (ref 4.0–10.5)
nRBC: 0 % (ref 0.0–0.2)

## 2018-10-28 LAB — COMPREHENSIVE METABOLIC PANEL
ALT: 27 U/L (ref 0–44)
AST: 28 U/L (ref 15–41)
Albumin: 4.7 g/dL (ref 3.5–5.0)
Alkaline Phosphatase: 62 U/L (ref 38–126)
Anion gap: 11 (ref 5–15)
BUN: 10 mg/dL (ref 8–23)
CO2: 24 mmol/L (ref 22–32)
Calcium: 9.5 mg/dL (ref 8.9–10.3)
Chloride: 106 mmol/L (ref 98–111)
Creatinine, Ser: 0.88 mg/dL (ref 0.44–1.00)
GFR calc Af Amer: >60 mL/min (ref >60)
GFR calc non Af Amer: >60 mL/min (ref >60)
Glucose, Bld: 100 mg/dL — ABNORMAL HIGH (ref 70–99)
Potassium: 3.4 mmol/L — ABNORMAL LOW (ref 3.5–5.1)
Sodium: 141 mmol/L (ref 135–145)
Total Bilirubin: 0.4 mg/dL (ref 0.3–1.2)
Total Protein: 7.6 g/dL (ref 6.5–8.1)

## 2018-10-28 LAB — URINALYSIS, ROUTINE W REFLEX MICROSCOPIC
Bacteria, UA: NONE SEEN
Bilirubin Urine: NEGATIVE
Glucose, UA: NEGATIVE mg/dL
Hgb urine dipstick: NEGATIVE
Ketones, ur: NEGATIVE mg/dL
Nitrite: NEGATIVE
Protein, ur: NEGATIVE mg/dL
Specific Gravity, Urine: >1.046 — ABNORMAL HIGH (ref 1.005–1.030)
pH: 5 (ref 5.0–8.0)

## 2018-10-28 LAB — LIPASE, BLOOD: Lipase: 33 U/L (ref 11–51)

## 2018-10-28 MED ORDER — SODIUM CHLORIDE 0.9 % IV BOLUS
1000.0000 mL | Freq: Once | INTRAVENOUS | Status: AC
  Administered 2018-10-28: 1000 mL via INTRAVENOUS

## 2018-10-28 MED ORDER — IOHEXOL 300 MG/ML  SOLN
100.0000 mL | Freq: Once | INTRAMUSCULAR | Status: AC | PRN
  Administered 2018-10-28: 100 mL via INTRAVENOUS

## 2018-10-28 MED ORDER — SUCRALFATE 1 GM/10ML PO SUSP: 1.0000 g | Freq: Three times a day (TID) | ORAL | 0 refills | Status: DC

## 2018-10-28 MED ORDER — MORPHINE SULFATE (PF) 4 MG/ML IV SOLN
4.0000 mg | Freq: Once | INTRAVENOUS | Status: AC
  Administered 2018-10-28: 18:00:00 4 mg via INTRAVENOUS
  Filled 2018-10-28: qty 1

## 2018-10-28 MED ORDER — ONDANSETRON HCL 4 MG PO TABS: 4.0000 mg | ORAL_TABLET | Freq: Three times a day (TID) | ORAL | 0 refills | Status: DC | PRN

## 2018-10-28 MED ORDER — ONDANSETRON HCL 4 MG/2ML IJ SOLN
4.0000 mg | Freq: Once | INTRAMUSCULAR | Status: AC
  Administered 2018-10-28: 18:00:00 4 mg via INTRAVENOUS
  Filled 2018-10-28: qty 2

## 2018-10-28 MED ORDER — SUCRALFATE 1 GM/10ML PO SUSP
1.0000 g | Freq: Once | ORAL | Status: AC
  Administered 2018-10-28: 23:00:00 1 g via ORAL
  Filled 2018-10-28: qty 10

## 2018-10-28 NOTE — ED Triage Notes (Signed)
Pt states that she is having abd for the past 2 weeks she has had nausea and vomiting and the pain is going through to her bad.

## 2018-10-28 NOTE — ED Provider Notes (Signed)
[facility] EMERGENCY DEPARTMENT Provider Note   CSN: [id number] Arrival date & time: 10/28/18  1607     History   Chief Complaint Chief Complaint  Patient presents with  . Abdominal Pain    HPI Rhonda Sullivan is a 63 y.o. female.  She has no significant past medical history.  She said she has had about 2 weeks of abdominal symptoms.  She started with one episode of vomiting and diarrhea that lasted for a few days.  Since then she has had diffuse upper abdominal pain 5 out of 10 intensity crampy that radiates throughout her abdomen and into her back.  She has ongoing nausea and lack of appetite.  She had a Covid test by her PCP that was negative.  She continues to have pain and nausea.  She has had chills although no fever.  No urinary symptoms other than her urine has been dark.  She does not think she has had any rectal bleeding although she says her stools have been maybe a little darker.  No sick contacts or recent travel.     The history is provided by the patient.  Abdominal Pain Pain location:  Generalized Pain quality: aching   Pain radiates to:  Back Pain severity:  Moderate Onset quality:  Gradual Timing:  Constant Progression:  Worsening Chronicity:  New Context: not recent travel, not sick contacts, not suspicious food intake and not trauma   Relieved by:  None tried Worsened by:  Nothing Ineffective treatments:  None tried Associated symptoms: anorexia, chills, diarrhea, fatigue, melena (??), nausea and vomiting   Associated symptoms: no chest pain, no cough, no dysuria, no fever, no hematemesis, no hematochezia, no hematuria, no shortness of breath and no sore throat     Past Medical History:  Diagnosis Date  . GERD (gastroesophageal reflux disease)     There are no active problems to display for this patient.   Past Surgical History:  Procedure Laterality Date  . ABDOMINAL HYSTERECTOMY       OB History   No obstetric history on file.      Home  Medications    Prior to Admission medications   Medication Sig Start Date End Date Taking? Authorizing Provider  famotidine (PEPCID) 20 MG tablet Take 1 tablet (20 mg total) by mouth 2 (two) times daily. 02/28/16   Francine Graven, DO  HYDROcodone-acetaminophen (NORCO/VICODIN) 5-325 MG tablet 1 or 2 tabs PO q6 hours prn pain 02/28/16   Francine Graven, DO  ondansetron (ZOFRAN) 4 MG tablet Take 1 tablet (4 mg total) by mouth every 8 (eight) hours as needed for nausea or vomiting. 02/28/16   Francine Graven, DO    Family History No family history on file.  Social History Social History   Tobacco Use  . Smoking status: Never Smoker  . Smokeless tobacco: Never Used  Substance Use Topics  . Alcohol use: No  . Drug use: Not on file     Allergies   Other   Review of Systems Review of Systems  Constitutional: Positive for chills and fatigue. Negative for fever.  HENT: Negative for sore throat.   Eyes: Negative for visual disturbance.  Respiratory: Negative for cough and shortness of breath.   Cardiovascular: Negative for chest pain.  Gastrointestinal: Positive for abdominal pain, anorexia, diarrhea, melena (??), nausea and vomiting. Negative for hematemesis and hematochezia.  Genitourinary: Negative for dysuria and hematuria.  Musculoskeletal: Positive for back pain.  Skin: Negative for rash.  Neurological: Negative for  headaches.     Physical Exam Updated Vital Signs BP 130/79 (BP Location: Right Arm)   Pulse 79   Temp 98.7 F (37.1 C) (Oral)   Resp 14   Ht 5' 2"  (1.575 m)   Wt 68.9 kg   SpO2 97%   BMI 27.80 kg/m   Physical Exam Vitals signs and nursing note reviewed.  Constitutional:      General: She is not in acute distress.    Appearance: She is well-developed.  HENT:     Head: Normocephalic and atraumatic.  Eyes:     Conjunctiva/sclera: Conjunctivae normal.  Neck:     Musculoskeletal: Neck supple.  Cardiovascular:     Rate and Rhythm: Normal  rate and regular rhythm.     Heart sounds: No murmur.  Pulmonary:     Effort: Pulmonary effort is normal. No respiratory distress.     Breath sounds: Normal breath sounds.  Abdominal:     Palpations: Abdomen is soft. There is no pulsatile mass.     Tenderness: There is generalized abdominal tenderness. There is no guarding or rebound.  Musculoskeletal: Normal range of motion.        General: No signs of injury.     Right lower leg: No edema.     Left lower leg: No edema.  Skin:    General: Skin is warm and dry.     Capillary Refill: Capillary refill takes less than 2 seconds.  Neurological:     General: No focal deficit present.     Mental Status: She is alert and oriented to person, place, and time.      ED Treatments / Results  Labs (all labs ordered are listed, but only abnormal results are displayed) Labs Reviewed  CBC WITH DIFFERENTIAL/PLATELET - Abnormal; Notable for the following components:      Result Value   RBC 5.42 (*)    Hemoglobin 15.4 (*)    HCT 47.8 (*)    All other components within normal limits  COMPREHENSIVE METABOLIC PANEL - Abnormal; Notable for the following components:   Potassium 3.4 (*)    Glucose, Bld 100 (*)    All other components within normal limits  URINALYSIS, ROUTINE W REFLEX MICROSCOPIC - Abnormal; Notable for the following components:   Specific Gravity, Urine >1.046 (*)    Leukocytes,Ua TRACE (*)    All other components within normal limits  LIPASE, BLOOD    EKG EKG Interpretation  Date/Time:  Sunday October 28 2018 17:46:27 EDT Ventricular Rate:  80 PR Interval:    QRS Duration: 131 QT Interval:  427 QTC Calculation: 493 R Axis:   95 Text Interpretation:  Sinus rhythm RBBB and LPFB similar to prior 11/17 Confirmed by Aletta Edouard [phone]) on 10/28/2018 5:51:21 PM   Radiology Ct Abdomen Pelvis W Contrast  Result Date: 10/28/2018 CLINICAL DATA:  Nausea, vomiting, abdominal pain EXAM: CT ABDOMEN AND PELVIS WITH CONTRAST  TECHNIQUE: Multidetector CT imaging of the abdomen and pelvis was performed using the standard protocol following bolus administration of intravenous contrast. CONTRAST:  121m OMNIPAQUE IOHEXOL 300 MG/ML  SOLN COMPARISON:  None. FINDINGS: Lower chest: Bibasilar partial atelectasis or scarring. Hepatobiliary: No solid liver abnormality is seen. Hepatic steatosis. No gallstones, gallbladder wall thickening, or biliary dilatation. Pancreas: Unremarkable. No pancreatic ductal dilatation or surrounding inflammatory changes. Spleen: Normal in size without significant abnormality. Adrenals/Urinary Tract: Adrenal glands are unremarkable. Kidneys are normal, without renal calculi, solid lesion, or hydronephrosis. Bladder is unremarkable. Stomach/Bowel: Stomach is within normal  limits. Appendix appears normal. No evidence of bowel wall thickening, distention, or inflammatory changes. Occasional sigmoid diverticula. Vascular/Lymphatic: Aortic atherosclerosis. No enlarged abdominal or pelvic lymph nodes. Reproductive: Status post hysterectomy. Other: No abdominal wall hernia or abnormality. No abdominopelvic ascites. Musculoskeletal: No acute or significant osseous findings. IMPRESSION: 1.  No acute CT findings of the abdomen or pelvis to explain pain. 2.  Hepatic steatosis. 3. Other chronic, incidental, and postoperative findings as detailed above. Electronically Signed   By: Eddie Candle M.D.   On: 10/28/2018 19:08    Procedures Procedures (including critical care time)  Medications Ordered in ED Medications  morphine 4 MG/ML injection 4 mg (4 mg Intravenous Given 10/28/18 1738)  ondansetron (ZOFRAN) injection 4 mg (4 mg Intravenous Given 10/28/18 1738)  sodium chloride 0.9 % bolus 1,000 mL (0 mLs Intravenous Stopped 10/28/18 2109)  iohexol (OMNIPAQUE) 300 MG/ML solution 100 mL (100 mLs Intravenous Contrast Given 10/28/18 1900)  sodium chloride 0.9 % bolus 1,000 mL (0 mLs Intravenous Stopped 10/28/18 2310)   sucralfate (CARAFATE) 1 GM/10ML suspension 1 g (1 g Oral Given 10/28/18 2312)     Initial Impression / Assessment and Plan / ED Course  I have reviewed the triage vital signs and the nursing notes.  Pertinent labs & imaging results that were available during my care of the patient were reviewed by me and considered in my medical decision making (see chart for details).  Clinical Course as of Oct 28 940  Sun Oct 28, 1854  [id number] 63 year old female here with 2 weeks of abdominal pain nausea chills which started with one episode of vomiting and diarrhea.  She looks uncomfortable although her abdominal exam is soft without any focal tenderness.  Getting an EKG and lab work and your some IV fluids pain medicine nausea medication.  Put her in for a CT abdomen and pelvis.  Differential includes cholecystitis, Pilo, pancreatitis, obstruction, gastroenteritis, diverticulitis.   [MB]    Clinical Course User Index [MB] Hayden Rasmussen, MD       Workup unremarkable. Dehydrated, given iv fluids. Still with epigastric pain, given carafate. Will need gi followup.   Final Clinical Impressions(s) / ED Diagnoses   Final diagnoses:  Epigastric pain  Nausea    ED Discharge Orders         Ordered    sucralfate (CARAFATE) 1 GM/10ML suspension  3 times daily with meals & bedtime     10/28/18 2313    ondansetron (ZOFRAN) 4 MG tablet  Every 8 hours PRN     10/28/18 2313           Hayden Rasmussen, MD 10/29/18 [phone]

## 2018-10-28 NOTE — ED Notes (Signed)
Water given

## 2018-10-28 NOTE — Discharge Instructions (Signed)
You were seen in the emergency department for 2 weeks of upper abdominal pain and nausea.  You had blood work urinalysis and a CAT scan that did not show any serious findings.  We are prescribing you some Carafate to help coat your stomach and protected and you should continue your acid medications.  We are also prescribing you some nausea medication.  Please follow-up with your doctor this week and we are also giving you the number for GI specialist.  Return to the emergency department if any worsening symptoms.

## 2018-11-07 ENCOUNTER — Other Ambulatory Visit: Payer: Self-pay

## 2018-11-07 ENCOUNTER — Ambulatory Visit (INDEPENDENT_AMBULATORY_CARE_PROVIDER_SITE_OTHER): Payer: BC Managed Care – PPO | Admitting: Nurse Practitioner

## 2018-11-07 ENCOUNTER — Encounter (INDEPENDENT_AMBULATORY_CARE_PROVIDER_SITE_OTHER): Payer: Self-pay | Admitting: Nurse Practitioner

## 2018-11-07 ENCOUNTER — Encounter (INDEPENDENT_AMBULATORY_CARE_PROVIDER_SITE_OTHER): Payer: Self-pay | Admitting: *Deleted

## 2018-11-07 VITALS — BP 124/75 | HR 59 | Temp 98.2°F | Ht 62.0 in | Wt 152.3 lb

## 2018-11-07 DIAGNOSIS — R1013 Epigastric pain: Secondary | ICD-10-CM

## 2018-11-07 DIAGNOSIS — R110 Nausea: Secondary | ICD-10-CM | POA: Diagnosis not present

## 2018-11-07 NOTE — Patient Instructions (Signed)
Schedule an abdominal sonogram  Avoid fatty foods  Stop Omeprazole. Start Pantoprazole 82m once daily.  Stop Carafate  For constipation use Miralax 1 capful mixed in 8 ounces of water at bed time as needed  Call our office if your nausea or abdominal pain worsens, [facility]  Discussed possible EGD (upper endoscopy)  Follow up in our office in 4 weeks

## 2018-11-07 NOTE — Progress Notes (Addendum)
   Subjective:    Patient ID: Rhonda Sullivan, female    DOB: [birth year], 63 y.o.   MRN: [id number]  HPI  Rhonda Sullivan is a 63 y.o. female with a PMH of GERD on Omeprazole 30m QD. She presents today with complains of epigastric pain that radiates straight through to her back with nausea and vomiting which started 4 weeks ago. She initially had diarrhea for 1 or 2 days which resolved. She is now having "attacks" of upper abdominal pain that goes through to her back. These episodes occur randomly, mostly during the day and on once occasion occurred at night time and awakened her from sleep. No heartburn or dysphagia. She went to the ER on 10/28/2018 during an episode of severe epigastric pain which radiated to her back. Lipase 33. AST 28. ALT 27. WBC 7.7. Hg 15.4. HCT 47.8.  A CTAP with contrast did not show any acute findings to explain her pain, hepatic steatosis noted, pancreas and gallbladder were normal. She was prescribed Carafate and Ondansetron. Covid test by her PCP was negative. She is now having constipation since starting Carafate. Stool are now formed, no rectal bleeding or melena. She reported having a colonoscopy approximately 4 years ago which she reports was normal.    Past Medical History:  Diagnosis Date  . GERD (gastroesophageal reflux disease)    Past Surgical History:  Procedure Laterality Date  . ABDOMINAL HYSTERECTOMY     Current Outpatient Medications on File Prior to Visit  Medication Sig Dispense Refill  . omeprazole (PRILOSEC) 20 MG capsule Take 1 capsule by mouth daily.    . sucralfate (CARAFATE) 1 GM/10ML suspension Take 10 mLs (1 g total) by mouth 4 (four) times daily -  with meals and at bedtime. 420 mL 0   No current facility-administered medications on file prior to visit.    Allergies  Allergen Reactions  . Other     metal   Review of Systems See HPI all other systems reviewed and are negative      Objective:   Physical Exam  BP 124/75   Pulse (!) 59    Temp 98.2 F (36.8 C)   Ht 5' 2"  (1.575 m)   Wt 152 lb 4.8 oz (69.1 kg)   BMI 27.86 kg/m   General: 63y.o. female in NAD Eyes: sclera nonicteric, conjunctiva pink Neck: supple, no thyromegaly or lymphadenopathy Heart: RRR, no murmur Lungs:  Clear throughout  Abdomen: mild epigastric tenderness without rebound or guarding, + BS x 4 quads, no HSM Extremities: no edema    Assessment & Plan:   1. 63y.o. female with episodic epigastric pain which radiates through to her shoulder blades with nausea suggestive of biliary colic, normal LFTs and lipase levels, CTAP showed a normal gallbladder -abdominal sonogram to rule out gallstones  -discussed scheduling a [facility] HIDA vs  EGD if no gallstones seen on sono  2. History of GERD -Addendum: patient to start Pantoprazole 450monce daily -Stop Carafate  3. Constipation -Miralax 1 capful mixed in 8 ounces of water daily as needed for constipation  -will request copy of past colonoscopy report at time of next follow up appt

## 2018-11-08 ENCOUNTER — Telehealth (INDEPENDENT_AMBULATORY_CARE_PROVIDER_SITE_OTHER): Payer: Self-pay | Admitting: Nurse Practitioner

## 2018-11-08 MED ORDER — PANTOPRAZOLE SODIUM 40 MG PO TBEC: 40.0000 mg | DELAYED_RELEASE_TABLET | Freq: Every day | ORAL | 1 refills | Status: DC

## 2018-11-08 NOTE — Addendum Note (Signed)
Addended by: Noralyn Pick on: 11/08/2018 07:26 PM   Modules accepted: Orders

## 2018-11-08 NOTE — Telephone Encounter (Signed)
RX for Pantoprazole 26m once daily sent to [facility]

## 2018-11-08 NOTE — Telephone Encounter (Signed)
Patient called stated she was to have a prescription sent to her pharmacy and states she hasn't heard from them - her ph# [phone]

## 2018-11-12 ENCOUNTER — Telehealth (INDEPENDENT_AMBULATORY_CARE_PROVIDER_SITE_OTHER): Payer: Self-pay | Admitting: Nurse Practitioner

## 2018-11-12 NOTE — Telephone Encounter (Signed)
I returned patient's call. She stated Protonix 41m in the am is working well but by night time she is having heartburn. I advised she take Protonix 46mtwice daily to be taken 30 minutes before breakfast and dinner. Abd sono scheduled for tomorrow. Await sono results.

## 2018-11-12 NOTE — Telephone Encounter (Signed)
Patient called has questions about the medication she was put on - ph# [phone]

## 2018-11-13 ENCOUNTER — Ambulatory Visit (HOSPITAL_COMMUNITY)
Admission: RE | Admit: 2018-11-13 | Discharge: 2018-11-13 | Disposition: A | Discharge status: Home / Self Care | Payer: BC Managed Care – PPO | Source: Ambulatory Visit | Attending: Nurse Practitioner | Admitting: Nurse Practitioner

## 2018-11-13 ENCOUNTER — Other Ambulatory Visit: Payer: Self-pay

## 2018-11-13 DIAGNOSIS — R1013 Epigastric pain: Secondary | ICD-10-CM | POA: Insufficient documentation

## 2018-11-13 DIAGNOSIS — R110 Nausea: Secondary | ICD-10-CM | POA: Diagnosis not present

## 2018-11-14 ENCOUNTER — Other Ambulatory Visit (INDEPENDENT_AMBULATORY_CARE_PROVIDER_SITE_OTHER): Payer: Self-pay | Admitting: Nurse Practitioner

## 2018-11-14 ENCOUNTER — Telehealth (INDEPENDENT_AMBULATORY_CARE_PROVIDER_SITE_OTHER): Payer: Self-pay | Admitting: *Deleted

## 2018-11-14 NOTE — Telephone Encounter (Signed)
Patient got the medication on 11/09/2018.  Patient is wanting the results of her [place], result is in [place].

## 2018-11-14 NOTE — Telephone Encounter (Signed)
Voice Mail was left checking on her medication that [facility] sent in on 11/08/2018 and states [facility].  Could you check on this please

## 2018-11-14 NOTE — Telephone Encounter (Signed)
I called patient, sx a little better on Protonix 80m bid. She would like to schedule a CCK HIDA scan and EGD. I will have Ann schedule the HIDA and EGD.

## 2018-11-15 ENCOUNTER — Telehealth (INDEPENDENT_AMBULATORY_CARE_PROVIDER_SITE_OTHER): Payer: Self-pay | Admitting: Nurse Practitioner

## 2018-11-15 ENCOUNTER — Encounter (INDEPENDENT_AMBULATORY_CARE_PROVIDER_SITE_OTHER): Payer: Self-pay | Admitting: *Deleted

## 2018-11-15 ENCOUNTER — Other Ambulatory Visit (INDEPENDENT_AMBULATORY_CARE_PROVIDER_SITE_OTHER): Payer: Self-pay | Admitting: Nurse Practitioner

## 2018-11-15 DIAGNOSIS — R1013 Epigastric pain: Secondary | ICD-10-CM

## 2018-11-15 DIAGNOSIS — R1011 Right upper quadrant pain: Secondary | ICD-10-CM | POA: Insufficient documentation

## 2018-11-15 NOTE — Telephone Encounter (Signed)
Pls enter abd sono recall 6 months d/t 31m gallbladder polyp and request past colonoscopy report ? 4 years ago. Schedule CCK HIDA and EGD.

## 2018-11-15 NOTE — Telephone Encounter (Signed)
6 mth [place] Noted in recall, last TCS was done by Dr Stann Mainland, patient will try ot get [place] a copy of it, HIDA sch'd 11/22/18 and EGD sch'd 12/26/18, patient aware, instrucitons mailed

## 2018-11-22 ENCOUNTER — Other Ambulatory Visit: Payer: Self-pay

## 2018-11-22 ENCOUNTER — Encounter (HOSPITAL_COMMUNITY): Payer: Self-pay

## 2018-11-22 ENCOUNTER — Encounter (HOSPITAL_COMMUNITY)
Admission: RE | Admit: 2018-11-22 | Discharge: 2018-11-22 | Disposition: A | Discharge status: Home / Self Care | Payer: BC Managed Care – PPO | Source: Ambulatory Visit | Attending: Nurse Practitioner | Admitting: Nurse Practitioner

## 2018-11-22 DIAGNOSIS — R1011 Right upper quadrant pain: Secondary | ICD-10-CM | POA: Diagnosis present

## 2018-11-22 MED ORDER — TECHNETIUM TC 99M MEBROFENIN IV KIT
5.0000 | PACK | Freq: Once | INTRAVENOUS | Status: AC | PRN
  Administered 2018-11-22: 08:00:00 5.2 via INTRAVENOUS

## 2018-11-26 ENCOUNTER — Telehealth (INDEPENDENT_AMBULATORY_CARE_PROVIDER_SITE_OTHER): Payer: Self-pay | Admitting: Nurse Practitioner

## 2018-11-26 NOTE — Telephone Encounter (Signed)
Please call patient she has some questions - ph# [phone]

## 2018-11-27 NOTE — Telephone Encounter (Signed)
Patient is having diarrhea since taking Pantoprazole 67m bid. She reduced Pantoprazole 497monce daily x 2 days and she is still having watery diarrhea, no blood. Pantoprazole significantly reduced her upper abd pain. I advised pt to stop Pantoprazole, start Pepcid 2076mne tab po bid. She will call me in 2 days with sx update. EGD is scheduled next week.

## 2018-12-03 ENCOUNTER — Telehealth (INDEPENDENT_AMBULATORY_CARE_PROVIDER_SITE_OTHER): Payer: Self-pay | Admitting: Nurse Practitioner

## 2018-12-03 NOTE — Telephone Encounter (Signed)
Patient left message stating she has a follow up appointment on 9-2 and she is scheduled for an EGD on 9-3  -  Patient is wanting to know if she should still come into the office before the EGD?  Ph# [phone]

## 2018-12-04 ENCOUNTER — Other Ambulatory Visit: Payer: Self-pay

## 2018-12-04 ENCOUNTER — Other Ambulatory Visit (HOSPITAL_COMMUNITY)
Admission: RE | Admit: 2018-12-04 | Discharge: 2018-12-04 | Disposition: A | Discharge status: Home / Self Care | Payer: BC Managed Care – PPO | Source: Ambulatory Visit | Attending: Internal Medicine | Admitting: Internal Medicine

## 2018-12-04 DIAGNOSIS — Z20828 Contact with and (suspected) exposure to other viral communicable diseases: Secondary | ICD-10-CM | POA: Insufficient documentation

## 2018-12-04 DIAGNOSIS — Z01812 Encounter for preprocedural laboratory examination: Secondary | ICD-10-CM | POA: Insufficient documentation

## 2018-12-04 LAB — SARS CORONAVIRUS 2 (TAT 6-24 HRS): SARS Coronavirus 2: NEGATIVE

## 2018-12-04 NOTE — Telephone Encounter (Signed)
Rhonda Sullivan spoke with patient today, ok to cancel ov, further follow up to be determined after EGD completed.

## 2018-12-05 ENCOUNTER — Ambulatory Visit (INDEPENDENT_AMBULATORY_CARE_PROVIDER_SITE_OTHER): Payer: BC Managed Care – PPO | Admitting: Nurse Practitioner

## 2018-12-06 ENCOUNTER — Encounter (HOSPITAL_COMMUNITY): Payer: Self-pay | Admitting: *Deleted

## 2018-12-06 ENCOUNTER — Other Ambulatory Visit: Payer: Self-pay

## 2018-12-06 ENCOUNTER — Ambulatory Visit (HOSPITAL_COMMUNITY)
Admission: RE | Admit: 2018-12-06 | Discharge: 2018-12-06 | Disposition: A | Discharge status: Home / Self Care | Payer: BC Managed Care – PPO | Attending: Internal Medicine | Admitting: Internal Medicine

## 2018-12-06 ENCOUNTER — Encounter (HOSPITAL_COMMUNITY)
Admission: RE | Disposition: A | Discharge status: Home / Self Care | Payer: Self-pay | Source: Home / Self Care | Attending: Internal Medicine

## 2018-12-06 DIAGNOSIS — K317 Polyp of stomach and duodenum: Secondary | ICD-10-CM

## 2018-12-06 DIAGNOSIS — R112 Nausea with vomiting, unspecified: Secondary | ICD-10-CM

## 2018-12-06 DIAGNOSIS — D72820 Lymphocytosis (symptomatic): Secondary | ICD-10-CM | POA: Diagnosis not present

## 2018-12-06 DIAGNOSIS — R1013 Epigastric pain: Secondary | ICD-10-CM | POA: Insufficient documentation

## 2018-12-06 DIAGNOSIS — K210 Gastro-esophageal reflux disease with esophagitis: Secondary | ICD-10-CM

## 2018-12-06 DIAGNOSIS — K2210 Ulcer of esophagus without bleeding: Secondary | ICD-10-CM | POA: Diagnosis not present

## 2018-12-06 DIAGNOSIS — R1011 Right upper quadrant pain: Secondary | ICD-10-CM | POA: Insufficient documentation

## 2018-12-06 HISTORY — PX: BIOPSY: SHX5522

## 2018-12-06 HISTORY — PX: POLYPECTOMY: SHX5525

## 2018-12-06 HISTORY — PX: ESOPHAGOGASTRODUODENOSCOPY: SHX5428

## 2018-12-06 SURGERY — EGD (ESOPHAGOGASTRODUODENOSCOPY)
Anesthesia: Moderate Sedation

## 2018-12-06 MED ORDER — SODIUM CHLORIDE 0.9 % IV SOLN
INTRAVENOUS | Status: DC
  Administered 2018-12-06: 13:00:00 via INTRAVENOUS

## 2018-12-06 MED ORDER — ONDANSETRON HCL 4 MG/2ML IJ SOLN
INTRAMUSCULAR | Status: AC
  Filled 2018-12-06: qty 2

## 2018-12-06 MED ORDER — STERILE WATER FOR IRRIGATION IR SOLN
Status: DC | PRN
  Administered 2018-12-06: 14:00:00 2.5 mL

## 2018-12-06 MED ORDER — MEPERIDINE HCL 50 MG/ML IJ SOLN
INTRAMUSCULAR | Status: AC
  Filled 2018-12-06: qty 1

## 2018-12-06 MED ORDER — MIDAZOLAM HCL 5 MG/5ML IJ SOLN
INTRAMUSCULAR | Status: DC | PRN
  Administered 2018-12-06: 2 mg via INTRAVENOUS
  Administered 2018-12-06: 1 mg via INTRAVENOUS

## 2018-12-06 MED ORDER — MIDAZOLAM HCL 5 MG/5ML IJ SOLN
INTRAMUSCULAR | Status: AC
  Filled 2018-12-06: qty 10

## 2018-12-06 MED ORDER — LIDOCAINE VISCOUS HCL 2 % MT SOLN
OROMUCOSAL | Status: DC | PRN
  Administered 2018-12-06: 5 mL via OROMUCOSAL

## 2018-12-06 MED ORDER — ONDANSETRON HCL 4 MG PO TABS: 4.0000 mg | ORAL_TABLET | Freq: Three times a day (TID) | ORAL | 0 refills | Status: DC | PRN

## 2018-12-06 MED ORDER — MEPERIDINE HCL 50 MG/ML IJ SOLN
INTRAMUSCULAR | Status: DC | PRN
  Administered 2018-12-06 (×2): 25 mg

## 2018-12-06 MED ORDER — ONDANSETRON HCL 4 MG/2ML IJ SOLN
INTRAMUSCULAR | Status: DC | PRN
  Administered 2018-12-06: 4 mg via INTRAVENOUS

## 2018-12-06 MED ORDER — DICYCLOMINE HCL 10 MG PO CAPS: 10.0000 mg | ORAL_CAPSULE | Freq: Three times a day (TID) | ORAL | 2 refills | Status: DC

## 2018-12-06 MED ORDER — OMEPRAZOLE 20 MG PO CPDR: 20.0000 mg | DELAYED_RELEASE_CAPSULE | Freq: Every day | ORAL | 5 refills | Status: DC

## 2018-12-06 MED ORDER — LIDOCAINE VISCOUS HCL 2 % MT SOLN
OROMUCOSAL | Status: AC
  Filled 2018-12-06: qty 15

## 2018-12-06 NOTE — Op Note (Signed)
[facility] Patient Name: Rhonda Sullivan Procedure Date: 12/06/2018 1:17 PM MRN: [id number] Date of Birth: Sep 02, 1955 Attending MD: Hildred Laser , MD CSN: [id number] Age: 63 Admit Type: Outpatient Procedure:                Upper GI endoscopy Indications:              Epigastric abdominal pain, Nausea with vomiting Providers:                Hildred Laser, MD, Gwynneth Albright RN, RN,                            Randa Spike, Technician Referring MD:             Halford Chessman, MD Medicines:                Lidocaine spray, Meperidine 50 mg IV, Midazolam 3                            mg IV Complications:            No immediate complications. Estimated Blood Loss:     Estimated blood loss was minimal. Procedure:                Pre-Anesthesia Assessment:                           - Prior to the procedure, a History and Physical                            was performed, and patient medications and                            allergies were reviewed. The patient's tolerance of                            previous anesthesia was also reviewed. The risks                            and benefits of the procedure and the sedation                            options and risks were discussed with the patient.                            All questions were answered, and informed consent                            was obtained. Prior Anticoagulants: The patient has                            taken no previous anticoagulant or antiplatelet                            agents. ASA Grade Assessment: I - A normal, healthy  patient. After reviewing the risks and benefits,                            the patient was deemed in satisfactory condition to                            undergo the procedure.                           After obtaining informed consent, the endoscope was                            passed under direct vision. Throughout the   procedure, the patient's blood pressure, pulse, and                            oxygen saturations were monitored continuously. The                            [id number]-H190 ([id number]) scope was introduced through the                            mouth, and advanced to the second part of duodenum.                            The upper GI endoscopy was accomplished without                            difficulty. The patient tolerated the procedure                            well. Scope In: 1:49:03 PM Scope Out: 2:00:05 PM Total Procedure Duration: 0 hours 11 minutes 2 seconds  Findings:      The examined esophagus was normal.      LA Grade A (one or more mucosal breaks less than 5 mm, not extending       between tops of 2 mucosal folds) esophagitis was found at GEJ; 37 cm       from the incisors.      A few small sessile polyps with no stigmata of recent bleeding were       found in the gastric fundus. Biopsies were taken from three with a cold       forceps for histology. The pathology specimen was placed into Bottle       Number 2.      The exam of the stomach was otherwise normal.      The duodenal bulb and second portion of the duodenum were normal.       Biopsies were taken with a cold forceps for histology. The pathology       specimen was placed into Bottle Number 1. Impression:               - Normal esophagus.                           - LA Grade A reflux esophagitis.Single erosion at  GEJ.                           - A few gastric polyps. Three biopsied.                           - Normal duodenal bulb and second portion of the                            duodenum. Biopsied. Moderate Sedation:      Moderate (conscious) sedation was administered by the endoscopy nurse       and supervised by the endoscopist. The following parameters were       monitored: oxygen saturation, heart rate, blood pressure, CO2       capnography and response to care. Total physician  intraservice time was       17 minutes. Recommendation:           - Patient has a contact number available for                            emergencies. The signs and symptoms of potential                            delayed complications were discussed with the                            patient. Return to normal activities tomorrow.                            Written discharge instructions were provided to the                            patient.                           - Resume previous diet today.                           - No aspirin, ibuprofen, naproxen, or other                            non-steroidal anti-inflammatory drugs for 1 day.                           - Omeprazole 20 mg po qam.                           - dicyclomine 10 mg po ac.                           - Ondansetron 4 mg po bid prn.                           - Await pathology results. Procedure Code(s):        --- Professional ---  34287, Esophagogastroduodenoscopy, flexible,                            transoral; with biopsy, single or multiple                           G0500, Moderate sedation services provided by the                            same physician or other qualified health care                            professional performing a gastrointestinal                            endoscopic service that sedation supports,                            requiring the presence of an independent trained                            observer to assist in the monitoring of the                            patient's level of consciousness and physiological                            status; initial 15 minutes of intra-service time;                            patient age 37 years or older (additional time may                            be reported with [phone], as appropriate) Diagnosis Code(s):        --- Professional ---                           K21.0, Gastro-esophageal reflux disease with                             esophagitis                           K31.7, Polyp of stomach and duodenum                           R10.13, Epigastric pain                           R11.2, Nausea with vomiting, unspecified CPT copyright 2019 American Medical Association. All rights reserved. The codes documented in this report are preliminary and upon coder review may  be revised to meet current compliance requirements. Hildred Laser, MD Hildred Laser, MD 12/06/2018 2:13:19 PM This report has been signed electronically. Number of Addenda: 0

## 2018-12-06 NOTE — Discharge Instructions (Signed)
No aspirin or NSAIDs for 24 hours. Omeprazole 20 mg by mouth 30 minutes before breakfast daily. Dicyclomine 10 mg before each meal.  Try to take dose 30 minutes before meals.  To take at least 2 doses every day. Resume diet as before. No driving for 24 hours. Physician will call with biopsy results.   Upper Endoscopy, Adult, Care After This sheet gives you information about how to care for yourself after your procedure. Your health care provider may also give you more specific instructions. If you have problems or questions, contact your health care provider. What can I expect after the procedure? After the procedure, it is common to have:  A sore throat.  Mild stomach pain or discomfort.  Bloating.  Nausea. Follow these instructions at home:   Follow instructions from your health care provider about what to eat or drink after your procedure.  Return to your normal activities as told by your health care provider. Ask your health care provider what activities are safe for you.  Take over-the-counter and prescription medicines only as told by your health care provider.  Do not drive for 24 hours if you were given a sedative during your procedure.  Keep all follow-up visits as told by your health care provider. This is important. Contact a health care provider if you have:  A sore throat that lasts longer than one day.  Trouble swallowing. Get help right away if:  You vomit blood or your vomit looks like coffee grounds.  You have: ? A fever. ? Bloody, black, or tarry stools. ? A severe sore throat or you cannot swallow. ? Difficulty breathing. ? Severe pain in your chest or abdomen. Summary  After the procedure, it is common to have a sore throat, mild stomach discomfort, bloating, and nausea.  Do not drive for 24 hours if you were given a sedative during the procedure.  Follow instructions from your health care provider about what to eat or drink after your  procedure.  Return to your normal activities as told by your health care provider. This information is not intended to replace advice given to you by your health care provider. Make sure you discuss any questions you have with your health care provider. Document Released: 09/20/2011 Document Revised: 09/12/2017 Document Reviewed: 08/21/2017 Elsevier Patient Education  2020 [employer].

## 2018-12-06 NOTE — H&P (Addendum)
Rhonda Sullivan is an 62 y.o. female.   Chief Complaint: Patient is here for esophagogastroduodenoscopy. HPI: Patient is 63 year old Caucasian female who was present illness began about 6 weeks ago with nausea vomiting and abdominal pain.  She also has some diarrhea to begin with.  For 10 days later she was seen in emergency room for persistent symptoms.  Abdominal pelvic CT was unremarkable and so was her blood work.  She was prescribed pantoprazole and diarrhea relapse.  She then tried 20 mg daily and still had the same side effects.  She has taken some Pepcid which did not help much.  She continues to have these spells.  She has nausea  crampy abdominal pain and pain radiates into her back.  Since her ER visit she has had an ultrasound which was negative for cholelithiasis and a HIDA scan with [facility]CK which was normal.  She has lost 12 pounds.  She denies melena or rectal bleeding.  Last colonoscopy was in 2016. She does not take OTC NSAIDs. She does not smoke cigarettes or drink alcohol. Family history is negative for celiac disease.  Past Medical History:  Diagnosis Date  . GERD (gastroesophageal reflux disease)     Past Surgical History:  Procedure Laterality Date  . ABDOMINAL HYSTERECTOMY      History reviewed. No pertinent family history. Social History:  reports that she has never smoked. She has never used smokeless tobacco. She reports that she does not drink alcohol. No history on file for drug.  Allergies:  Allergies  Allergen Reactions  . Other Other (See Comments)    Metal- body rejects it     Medications Prior to Admission  Medication Sig Dispense Refill  . pantoprazole (PROTONIX) 40 MG tablet Take 1 tablet (40 mg total) by mouth daily. 90 tablet 1    No results found for this or any previous visit (from the past 48 hour(s)). No results found.  ROS  Blood pressure 122/78, pulse 69, temperature 98 F (36.7 C), temperature source Oral, resp. rate 14, height 5' 2"  (1.575  m), weight 66.7 kg, SpO2 100 %. Physical Exam  Constitutional: She appears well-developed and well-nourished.  HENT:  Mouth/Throat: Oropharynx is clear and moist.  Eyes: Conjunctivae are normal. No scleral icterus.  Neck: No thyromegaly present.  Cardiovascular: Normal rate, regular rhythm and normal heart sounds.  No murmur heard. Respiratory: Effort normal and breath sounds normal.  GI:  Abdomen is full.  She has laparoscopy scars.  On palpation abdomen is soft.  She has mild generalized tenderness without guarding.  No organomegaly or masses.  Musculoskeletal:        General: No edema.  Lymphadenopathy:    She has no cervical adenopathy.  Neurological: She is alert.  Skin: Skin is warm and dry.     Assessment/Plan Nausea vomiting and abdominal pain with negative work-up and unresponsive therapy. Diagnostic EGD.  Hildred Laser, MD 12/06/2018, 1:38 PM

## 2018-12-12 ENCOUNTER — Telehealth (INDEPENDENT_AMBULATORY_CARE_PROVIDER_SITE_OTHER): Payer: Self-pay | Admitting: Internal Medicine

## 2018-12-12 NOTE — Telephone Encounter (Signed)
Patient called stated Dr Laural Golden told her he would give her a call on Tuesday after her procedure - patient has some questions - please call [phone]

## 2018-12-13 ENCOUNTER — Other Ambulatory Visit (INDEPENDENT_AMBULATORY_CARE_PROVIDER_SITE_OTHER): Payer: Self-pay | Admitting: Nurse Practitioner

## 2018-12-13 ENCOUNTER — Encounter (HOSPITAL_COMMUNITY): Payer: Self-pay | Admitting: Internal Medicine

## 2018-12-13 DIAGNOSIS — K2980 Duodenitis without bleeding: Secondary | ICD-10-CM

## 2018-12-13 NOTE — Telephone Encounter (Signed)
I called patient. Her abdominal pain is much better but not completely resolved. She wanted to know if Dr. Laural Golden would prescribe her an antibiotic or Prednisone. I advised her that at this time there is no indication for either tx. I discussed her EGD results showed mild changes to her duodenum that could represent mild celiac dz. I ordered a celiac panel and H.pylori antibodies and patient will pick up lab order from office. I will forward this msg to Dr. Salley Slaughter as he has not yet viewed EGD bx results.

## 2018-12-17 ENCOUNTER — Other Ambulatory Visit (INDEPENDENT_AMBULATORY_CARE_PROVIDER_SITE_OTHER): Payer: Self-pay | Admitting: Internal Medicine

## 2018-12-17 NOTE — Telephone Encounter (Signed)
Talk with patient yesterday.  She has not had a chance to go to the lab for blood work. If she has celiac disease it would explain all of her symptoms.

## 2018-12-19 LAB — H PYLORI, IGM, IGG, IGA AB
H pylori, IgM Abs: <9 units (ref 0.0–8.9)
H. pylori, IgA Abs: <9 units (ref 0.0–8.9)
H. pylori, IgG AbS: 0.4 Index Value (ref 0.00–0.79)

## 2018-12-19 LAB — CELIAC DISEASE PANEL
Endomysial IgA: POSITIVE — AB
IgA/Immunoglobulin A, Serum: 128 mg/dL (ref 87–352)
Transglutaminase IgA: 34 U/mL — ABNORMAL HIGH (ref 0–3)

## 2018-12-20 ENCOUNTER — Telehealth (INDEPENDENT_AMBULATORY_CARE_PROVIDER_SITE_OTHER): Payer: Self-pay | Admitting: Nurse Practitioner

## 2018-12-20 NOTE — Telephone Encounter (Signed)
Patient left voice mail message wanting lab results  -  Ph# [phone]

## 2018-12-24 ENCOUNTER — Other Ambulatory Visit (HOSPITAL_COMMUNITY): Payer: BC Managed Care – PPO

## 2018-12-24 NOTE — Telephone Encounter (Signed)
Referral placed in Epic, dietician office will contact patient to schedule

## 2018-12-24 NOTE — Telephone Encounter (Signed)
I called patient, I discussed her lab results show positive for Celiac dz. I advised a gluten free diet, no wheat, barley or rye. Patient then stated she received a call from Dr. Laural Golden on Friday 9/05/05/2018 and he stated he would have our office contact patient to schedule an appt with a dietician. Follow up in office in 2 months.  Ann, this may be a duplicate, but can you contact pt to facilitate a dietician consult and schedule office visit in 2 months. thx

## 2019-01-15 ENCOUNTER — Encounter (INDEPENDENT_AMBULATORY_CARE_PROVIDER_SITE_OTHER): Payer: Self-pay | Admitting: *Deleted

## 2019-01-22 ENCOUNTER — Other Ambulatory Visit: Payer: Self-pay

## 2019-01-22 DIAGNOSIS — Z20822 Contact with and (suspected) exposure to covid-19: Secondary | ICD-10-CM

## 2019-01-23 ENCOUNTER — Ambulatory Visit: Payer: BC Managed Care – PPO | Admitting: Nutrition

## 2019-01-23 LAB — NOVEL CORONAVIRUS, NAA: SARS-CoV-2, NAA: DETECTED — AB

## 2019-02-05 ENCOUNTER — Other Ambulatory Visit: Payer: Self-pay | Admitting: *Deleted

## 2019-02-05 DIAGNOSIS — Z20822 Contact with and (suspected) exposure to covid-19: Secondary | ICD-10-CM

## 2019-02-07 LAB — NOVEL CORONAVIRUS, NAA: SARS-CoV-2, NAA: DETECTED — AB

## 2019-02-12 ENCOUNTER — Other Ambulatory Visit: Payer: Self-pay

## 2019-02-12 DIAGNOSIS — Z20822 Contact with and (suspected) exposure to covid-19: Secondary | ICD-10-CM

## 2019-02-14 LAB — NOVEL CORONAVIRUS, NAA: SARS-CoV-2, NAA: NOT DETECTED

## 2019-02-18 ENCOUNTER — Ambulatory Visit (INDEPENDENT_AMBULATORY_CARE_PROVIDER_SITE_OTHER): Payer: BC Managed Care – PPO | Admitting: Nurse Practitioner

## 2019-02-20 ENCOUNTER — Encounter (INDEPENDENT_AMBULATORY_CARE_PROVIDER_SITE_OTHER): Payer: Self-pay | Admitting: Nurse Practitioner

## 2019-02-20 ENCOUNTER — Ambulatory Visit (INDEPENDENT_AMBULATORY_CARE_PROVIDER_SITE_OTHER): Payer: BC Managed Care – PPO | Admitting: Nurse Practitioner

## 2019-02-20 ENCOUNTER — Other Ambulatory Visit: Payer: Self-pay

## 2019-02-20 DIAGNOSIS — K900 Celiac disease: Secondary | ICD-10-CM | POA: Insufficient documentation

## 2019-02-20 NOTE — Patient Instructions (Signed)
1.  Continue gluten-free diet  2.  Follow-up in the office in 6 months, Dr. Dorien Chihuahua to verify your colonoscopy recall date at the time of this appointment  3.  If your tongue burning continues, you may consider decreasing omeprazole 20 mg to every other day if tolerated.  You may also try  folic acid 1 mg daily for 2 weeks.  If no improvement I would recommend you contact your primary care physician for a referral to see an oral surgeon who is a tongue specialist.

## 2019-02-20 NOTE — Progress Notes (Signed)
   Subjective:    Patient ID: Rhonda Sullivan, female    DOB: Aug 31, 1955, 63 y.o.   MRN: [id number]  HPI presents today for follow-up after being diagnosed with celiac disease by serology and confirmed by EGD duodenal biopsies.  She reports feeling significantly better since going on a gluten-free diet.  She denies having any further epigastric pain.  No further constipation.  She is passing a normal solid formed bowel movement once or twice daily.  No rectal bleeding.  She is scheduled to see a nutritionist in December to further review a gluten-free diet.  Her only complaint today is soreness to the sides of her tongue which is fairly new.  No problems with taste or smell. She was tested positive for Covid she was tested positive for Covid in October with mild symptoms.  She was retested for Covid last week and the results were negative.  She underwent a colonoscopy by [facility] 08/12/2014 which identified internal hemorrhoids, no polyps.   EGD 12/06/2018: - Normal esophagus. - LA Grade A reflux esophagitis.Single erosion at GEJ. - A few gastric polyps. Three biopsied. - Normal duodenal bulb and second portion of the duodenum. Biopsied. 1. Duodenum, Biopsy - DUODENAL MUCOSA WITH INTRAEPITHELIAL LYMPHOCYTOSIS AND MILD VILLOUS ARCHITECTURAL CHANGES. SEE NOTE 2. Stomach, polyp(s) - GASTRIC HYPERPLASTIC POLYPS - NEGATIVE FOR INTESTINAL METAPLASIA OR DYSPLASIA    Objective:   Physical Exam BP 103/61   Pulse 61   Temp (!) 97.3 F (36.3 C) (Oral)   Ht 5' 2"  (1.575 m)   Wt 154 lb 4.8 oz (70 kg)   BMI 28.22 kg/m   General: 63 year old female well-developed in no acute distress Eyes: Sclera nonicteric, conjunctiva pink Mouth: Tongue with minimal inflammation on the lateral borders without ulcers or lesions Heart: Regular rate and rhythm, no murmurs Lungs: Breath sounds clear throughout Abdomen: Soft, nontender, no masses or organomegaly Extremities: No edema Neuro: Alert and oriented x4, no  focal deficits     Assessment & Plan:   1.  Celiac disease diagnosed at the time of EGD 12/06/2018, + Endomysial IgA and Transglutaminase IgA levels. -Continue with a gluten-free diet -Patient to proceed with a nutritionist consult as scheduled December 2020 -Up in the office in 6 months  2.  Epigastric pain resolved on a gluten-free diet -Consider reducing omeprazole 20 mg to every other day  3.  Constipation resolved on a gluten-free diet  4.  Colon cancer screening.  Colonoscopy 08/12/2014, no polyps. -Dr. Dorien Chihuahua to verify colonoscopy recall date at time of follow-up appointment in 6 months -Patient will call our office if she develops any rectal bleeding  5.  Tongue soreness -Consider reducing Omeprazole 20 mg to every other day.  Brief trial of folic acid 1 mg p.o. daily for 2 weeks.  If no improvement follow-up with PCP for further management and evaluation.

## 2019-02-25 ENCOUNTER — Telehealth: Payer: Self-pay | Admitting: Nurse Practitioner

## 2019-02-25 NOTE — Telephone Encounter (Signed)
Ann pls enter colonoscopy recall May 20206 as verified by Dr. Laural Golden. thx

## 2019-02-25 NOTE — Telephone Encounter (Signed)
TCS noted in recall for 08/2024

## 2019-03-06 ENCOUNTER — Ambulatory Visit: Payer: BC Managed Care – PPO | Admitting: Nutrition

## 2019-03-07 ENCOUNTER — Other Ambulatory Visit (INDEPENDENT_AMBULATORY_CARE_PROVIDER_SITE_OTHER): Payer: Self-pay | Admitting: Internal Medicine

## 2019-03-07 ENCOUNTER — Other Ambulatory Visit (INDEPENDENT_AMBULATORY_CARE_PROVIDER_SITE_OTHER): Payer: Self-pay | Admitting: Nurse Practitioner

## 2019-03-07 MED ORDER — DICYCLOMINE HCL 10 MG PO CAPS: 10.0000 mg | ORAL_CAPSULE | Freq: Three times a day (TID) | ORAL | 1 refills | Status: DC

## 2019-04-18 ENCOUNTER — Telehealth (INDEPENDENT_AMBULATORY_CARE_PROVIDER_SITE_OTHER): Payer: Self-pay | Admitting: Gastroenterology

## 2019-04-18 NOTE — Telephone Encounter (Signed)
Patient called stated she is still having issues and has some questions to ask - please advise - [phone]

## 2019-04-19 NOTE — Telephone Encounter (Signed)
Talked with the patient. She may feel a little better. She has experienced stomach pain. She defines it as being bottom of ribs. It will hurt so bad that she will have to lay down for about 3 hours for the pain to go away. She c/o weakness , headaches.  Patient has been on a Gluten free diet and this has worked well for her till the other day.when the above happened. Patient states that when she got so sick last summer with this symptoms , she had been bitten by a tick. She has a strong history of being of being allergic to them and will et a terrible rash.. The rash was the size of a small paper plate and lasted 3-4 months.  She says that she mentioned nothing to her PCP or to [place] about being tested.for any Tick related diseases. Talked with her sister in law in has Lymes Disease , she suggested that she take Benadryl for her symptoms as this helped.  Discussed with Thayer Headings.

## 2019-04-19 NOTE — Telephone Encounter (Signed)
Per Thayer Headings - Patient may take the Dicyclomine as directed. Avoid any red meats , if patient no better in 1 -2 weeks will repeat the Celiac Panel to make sure there has been no cross contamination , with the patient being on Gluten Free Diet. She should follow up with her PCP to have Lab work relating to Tick bite.  Patient was called and given the recommendation. She agrees to take the Dicyclomine.  She will call Monday or in the next 1 week with a progress report.[name]

## 2019-04-19 NOTE — Telephone Encounter (Signed)
Hi Tammy - can you call patient and find out what is going on? May be better to do a telephone or office visit next week if still having symptoms. Thanks.

## 2019-04-19 NOTE — Telephone Encounter (Signed)
Agree w/ below plan. Would consider alpha gal testing if she doesn't get better w/ dicyclomine prn.

## 2019-04-30 ENCOUNTER — Encounter (INDEPENDENT_AMBULATORY_CARE_PROVIDER_SITE_OTHER): Payer: Self-pay | Admitting: *Deleted

## 2019-05-01 ENCOUNTER — Telehealth: Payer: BC Managed Care – PPO | Admitting: Nutrition

## 2019-05-03 ENCOUNTER — Telehealth (INDEPENDENT_AMBULATORY_CARE_PROVIDER_SITE_OTHER): Payer: Self-pay | Admitting: Gastroenterology

## 2019-05-03 DIAGNOSIS — R1011 Right upper quadrant pain: Secondary | ICD-10-CM

## 2019-05-03 DIAGNOSIS — R1013 Epigastric pain: Secondary | ICD-10-CM

## 2019-05-03 DIAGNOSIS — R110 Nausea: Secondary | ICD-10-CM

## 2019-05-03 NOTE — Telephone Encounter (Signed)
Patient left message regarding being tested for alpha gal - please advise - ph# [phone]

## 2019-05-03 NOTE — Telephone Encounter (Signed)
If still having symptoms and wants alpha gal testing I have placed an order in [facility] at [facility]. If she has further questions/concerns will need f/up appt (telehpne or OV) as I have never seen patient in past.

## 2019-05-27 ENCOUNTER — Other Ambulatory Visit: Payer: Self-pay

## 2019-05-27 ENCOUNTER — Ambulatory Visit (INDEPENDENT_AMBULATORY_CARE_PROVIDER_SITE_OTHER): Payer: BC Managed Care – PPO | Admitting: Gastroenterology

## 2019-05-27 ENCOUNTER — Encounter (INDEPENDENT_AMBULATORY_CARE_PROVIDER_SITE_OTHER): Payer: Self-pay | Admitting: Gastroenterology

## 2019-05-27 VITALS — BP 117/80 | HR 70 | Temp 97.0°F | Ht 62.0 in | Wt 157.3 lb

## 2019-05-27 DIAGNOSIS — K219 Gastro-esophageal reflux disease without esophagitis: Secondary | ICD-10-CM

## 2019-05-27 DIAGNOSIS — R110 Nausea: Secondary | ICD-10-CM

## 2019-05-27 DIAGNOSIS — Z91018 Allergy to other foods: Secondary | ICD-10-CM | POA: Diagnosis not present

## 2019-05-27 DIAGNOSIS — K900 Celiac disease: Secondary | ICD-10-CM

## 2019-05-27 NOTE — Patient Instructions (Addendum)
Limit ibuprofen - tylenol causes less stomach irritation   Try pantoprazole instead of omeprazole to see if decrease symptoms   If needed okay to use (2) 61m zofran to total 815m   We are requesting lab results from Dr. GoHilma Favors

## 2019-05-27 NOTE — Progress Notes (Signed)
Patient profile: Rhonda Sullivan is a 64 y.o. female seen for follow up of continued GI symptoms. Last seen in clinic on 12/2018.  History of Present Illness: Rhonda Sullivan is seen today for follow-up of continued GI symptoms.  She was evaluated in September 2020 for severe nausea, vomiting, weight loss.  She had endoscopy as below with an abnormal celiac panel.  She began avoiding wheat barley and rye and felt the nausea was better but still not resolved. She continued to have intermittent symptoms and she had a correlation that may be eating beef when having symptoms so ultimately after a few months was tested for alpha gal and this was positive.  Over the past 2 weeks she has been avoiding beef products.  Seen today with improvement in symptoms but is still having occasional symptoms.  She reports this morning feeling nauseated but felt okay over past few days til this AM.  She has tried Zofran 4 mg without relief of symptoms.  She does not have any abdominal pain with the nausea and no vomiting.  She does not feel any GERD symptoms on omeprazole.  She has researched that her omeprazole may have animal byproducts in the coating.  She has regained some of the weight she lost when she was extremely symptomatic as below  Her bowels are fairly regular about every other day.  No abdominal pain, rectal bleeding, melena.  Reports being bit by tick this summer around the time symptoms started.   Wt Readings from Last 3 Encounters:  05/27/19 157 lb 4.8 oz (71.4 kg)  02/20/19 154 lb 4.8 oz (70 kg)  12/06/18 147 lb (66.7 kg)     Last Colonoscopy: 2016-normal  Last Endoscopy: Endoscopy September 2020-grade a esophagitis, sessile polyps gastric fundus, pathology with duodenal biopsy showed an increased number intraepithelial lymphocytosis and mild villous architectural changes suggestive of possible early celiac disease.  Stomach polyps were hyperplastic   Past Medical History:  Past Medical  History:  Diagnosis Date  . GERD (gastroesophageal reflux disease)     Problem List: Patient Active Problem List   Diagnosis Date Noted  . Celiac disease 02/20/2019  . RUQ abdominal pain 11/15/2018  . Nausea without vomiting 11/07/2018  . Abdominal pain, epigastric 11/07/2018    Past Surgical History: Past Surgical History:  Procedure Laterality Date  . ABDOMINAL HYSTERECTOMY    . BIOPSY  12/06/2018   Procedure: BIOPSY;  Surgeon: Rogene Houston, MD;  [facility];  Service: Endoscopy;;  Duodenal Biopsies fopr celiac disease   . ESOPHAGOGASTRODUODENOSCOPY N/A 12/06/2018   Procedure: ESOPHAGOGASTRODUODENOSCOPY (EGD);  Surgeon: Rogene Houston, MD;  [facility];  Service: Endoscopy;  Laterality: N/A;  3:00  . POLYPECTOMY  12/06/2018   Procedure: POLYPECTOMY;  Surgeon: Rogene Houston, MD;  [facility];  Service: Endoscopy;;  gastric polyps biopsy     Allergies: Allergies  Allergen Reactions  . Other Other (See Comments)    Metal- body rejects it       Home Medications:  Current Outpatient Medications:  .  omeprazole (PRILOSEC) 20 MG capsule, Take 1 capsule (20 mg total) by mouth daily before breakfast., Disp: 30 capsule, Rfl: 5 .  ondansetron (ZOFRAN) 4 MG tablet, Take 1 tablet (4 mg total) by mouth every 8 (eight) hours as needed for nausea or vomiting., Disp: 20 tablet, Rfl: 0 .  oxybutynin (DITROPAN-XL) 10 MG 24 hr tablet, Take 10 mg by mouth daily., Disp: , Rfl:  Family History: family history is not on file.   Denies any GI family hx colon polyps/colon cancer, celiac etc   Social History:   reports that she has never smoked. She has never used smokeless tobacco. She reports that she does not drink alcohol.   Review of Systems: Constitutional: Denies weight loss/weight gain  Eyes: No changes in vision. ENT: No oral lesions, sore throat.  GI: see HPI.  Heme/Lymph: No easy bruising.  CV: No chest pain.  GU: No hematuria.    Integumentary: No rashes.  Neuro: No headaches.  Psych: No depression/anxiety.  Endocrine: No heat/cold intolerance.  Allergic/Immunologic: No urticaria.  Resp: No cough, SOB.  Musculoskeletal: No joint swelling.    Physical Examination: BP 117/80 (BP Location: Right Arm, Patient Position: Sitting, Cuff Size: Large)   Pulse 70   Temp (!) 97 F (36.1 C) (Temporal)   Ht 5' 2"  (1.575 m)   Wt 157 lb 4.8 oz (71.4 kg)   BMI 28.77 kg/m  Gen: NAD, alert and oriented x 4 HEENT: PEERLA, EOMI, Neck: supple, no JVD Chest: CTA bilaterally, no wheezes, crackles, or other adventitious sounds CV: RRR, no m/g/c/r Abd: soft, NT, ND, +BS in all four quadrants; no HSM, guarding, ridigity, or rebound tenderness Ext: no edema, well perfused with 2+ pulses, Skin: no rash or lesions noted on observed skin Lymph: no noted LAD  Data Reviewed:  Celiac panel 12/2018 Endomysial IgA Negative PositiveAbnormal    Transglutaminase IgA 0 - 3 U/mL 34 High    Comment:               Negative    0 - 3                 Weak Positive  4 - 10                 Positive      >10  Tissue Transglutaminase (tTG) has been identified  as the endomysial antigen. Studies have demonstr-  ated that endomysial IgA antibodies have over 99%  specificity for gluten sensitive enteropathy.   IgA/Immunoglobulin A, Serum 87 - 352 mg/dL 128      Assessment/Plan: Ms. Pellegrino is a 64 y.o. female    Natalye was seen today for follow-up.  Diagnoses and all orders for this visit:  Celiac disease -     Vitamin D (25 hydroxy) -     Vitamin B12 -     CBC with Differential -     COMPLETE METABOLIC PANEL WITH GFR -     TSH  Nausea without vomiting -     Vitamin D (25 hydroxy) -     Vitamin B12 -     CBC with Differential -     COMPLETE METABOLIC PANEL WITH GFR -     TSH  Chronic GERD  Allergy to alpha-gal    1. Celiac disease-has been gluten-free since  September 2020, continued to have intermittent severe nausea/vomiting and ultimately reports her PCP checked an alpha gal panel which was positive 2 weeks ago.  She does correlate that her breakthrough symptoms were after meat ingestion.  She continues to have some intermittent symptoms and feels that her omeprazole may have animal products in the coating.  She has pantoprazole at home and is going to try pantoprazole instead of omeprazole.  We will check vitamin D and B12 as above to exclude chronic vitamin deficiency in newly diagnosed celiac.   2.  Nausea-mild, history as above.  On further questioning she does take occasional NSAIDs intermittently.  She will try switching to Tylenol in case NSAIDs aggravating her nausea.  She has Zofran 4 mg, reviewed can take additional 4 mg totaling 8 mg if needed for symptoms.  Last basic labs were July 2020, repeat as above. She will let us know if her PPI change helps symptoms at all.     Recommendations:  Requesting recent alpha gal lab from PCP.  To notify me if nausea continues or worsens.  Labs as above.  If doing well follow-up 6 months   I personally performed the service, non-incident to. (WP)  Laurine Blazer, [facility] [place] [facility]

## 2019-05-28 ENCOUNTER — Other Ambulatory Visit (INDEPENDENT_AMBULATORY_CARE_PROVIDER_SITE_OTHER): Payer: Self-pay | Admitting: Gastroenterology

## 2019-05-28 LAB — CBC WITH DIFFERENTIAL/PLATELET
Absolute Monocytes: 570 cells/uL (ref 200–950)
Basophils Absolute: 113 cells/uL (ref 0–200)
Basophils Relative: 1.5 %
Eosinophils Absolute: 210 cells/uL (ref 15–500)
Eosinophils Relative: 2.8 %
HCT: 45.1 % — ABNORMAL HIGH (ref 35.0–45.0)
Hemoglobin: 14.9 g/dL (ref 11.7–15.5)
Lymphs Abs: 2483 cells/uL (ref 850–3900)
MCH: 28.6 pg (ref 27.0–33.0)
MCHC: 33 g/dL (ref 32.0–36.0)
MCV: 86.6 fL (ref 80.0–100.0)
MPV: 11.6 fL (ref 7.5–12.5)
Monocytes Relative: 7.6 %
Neutro Abs: 4125 cells/uL (ref 1500–7800)
Neutrophils Relative %: 55 %
Platelets: 227 10*3/uL (ref 140–400)
RBC: 5.21 10*6/uL — ABNORMAL HIGH (ref 3.80–5.10)
RDW: 12.8 % (ref 11.0–15.0)
Total Lymphocyte: 33.1 %
WBC: 7.5 10*3/uL (ref 3.8–10.8)

## 2019-05-28 LAB — VITAMIN D 25 HYDROXY (VIT D DEFICIENCY, FRACTURES): Vit D, 25-Hydroxy: 21 ng/mL — ABNORMAL LOW (ref 30–100)

## 2019-05-28 LAB — COMPLETE METABOLIC PANEL WITH GFR
AG Ratio: 1.9 (calc) (ref 1.0–2.5)
ALT: 24 U/L (ref 6–29)
AST: 23 U/L (ref 10–35)
Albumin: 4.6 g/dL (ref 3.6–5.1)
Alkaline phosphatase (APISO): 55 U/L (ref 37–153)
BUN: 15 mg/dL (ref 7–25)
CO2: 24 mmol/L (ref 20–32)
Calcium: 9.2 mg/dL (ref 8.6–10.4)
Chloride: 108 mmol/L (ref 98–110)
Creat: 0.88 mg/dL (ref 0.50–0.99)
GFR, Est African American: 80 mL/min/{1.73_m2} (ref >=60)
GFR, Est Non African American: 69 mL/min/{1.73_m2} (ref >=60)
Globulin: 2.4 g/dL (calc) (ref 1.9–3.7)
Glucose, Bld: 105 mg/dL (ref 65–139)
Potassium: 4.1 mmol/L (ref 3.5–5.3)
Sodium: 141 mmol/L (ref 135–146)
Total Bilirubin: 0.5 mg/dL (ref 0.2–1.2)
Total Protein: 7 g/dL (ref 6.1–8.1)

## 2019-05-28 MED ORDER — VITAMIN D (ERGOCALCIFEROL) 1.25 MG (50000 UNIT) PO CAPS: 50000.0000 [IU] | ORAL_CAPSULE | ORAL | 0 refills | Status: DC

## 2019-05-28 NOTE — Progress Notes (Signed)
Vitamin D replacement sent to pharmacy.

## 2019-07-02 ENCOUNTER — Other Ambulatory Visit (INDEPENDENT_AMBULATORY_CARE_PROVIDER_SITE_OTHER): Payer: Self-pay | Admitting: Internal Medicine

## 2019-08-20 ENCOUNTER — Ambulatory Visit (INDEPENDENT_AMBULATORY_CARE_PROVIDER_SITE_OTHER): Payer: BC Managed Care – PPO | Admitting: Internal Medicine

## 2019-08-20 ENCOUNTER — Other Ambulatory Visit: Payer: Self-pay

## 2019-10-08 ENCOUNTER — Other Ambulatory Visit (INDEPENDENT_AMBULATORY_CARE_PROVIDER_SITE_OTHER): Payer: Self-pay | Admitting: Gastroenterology

## 2019-10-08 MED ORDER — PANTOPRAZOLE SODIUM 40 MG PO TBEC
40.0000 mg | DELAYED_RELEASE_TABLET | Freq: Every day | ORAL | 3 refills | Status: DC
Start: 2019-10-08 — End: 2019-12-18

## 2019-10-08 NOTE — Progress Notes (Signed)
Refill sent to pharmacy for Protonix.

## 2019-11-01 ENCOUNTER — Ambulatory Visit: Payer: Self-pay | Admitting: Allergy & Immunology

## 2019-11-08 ENCOUNTER — Encounter: Payer: Self-pay | Admitting: Allergy & Immunology

## 2019-11-08 ENCOUNTER — Other Ambulatory Visit: Payer: Self-pay

## 2019-11-08 ENCOUNTER — Ambulatory Visit (INDEPENDENT_AMBULATORY_CARE_PROVIDER_SITE_OTHER): Payer: BC Managed Care – PPO | Admitting: Allergy & Immunology

## 2019-11-08 VITALS — BP 112/70 | HR 68 | Temp 98.2°F | Resp 16 | Ht 63.1 in | Wt 145.2 lb

## 2019-11-08 DIAGNOSIS — K900 Celiac disease: Secondary | ICD-10-CM

## 2019-11-08 DIAGNOSIS — T7800XD Anaphylactic reaction due to unspecified food, subsequent encounter: Secondary | ICD-10-CM | POA: Diagnosis not present

## 2019-11-08 NOTE — Patient Instructions (Addendum)
1. Abdominal pain with alpha gal syndrome and Celiac - Testing was positive to: Milk, Beef, Lamb, Acacia (Arabic Gum) - Avoid the above foods for now.  - Testing was negative to Peanut, Soy, Wheat, Sesame, Egg, Casein, Shellfish Mix , Fish Mix, Cashew, [place], Walnut, [place], [place], [place] nut, [place], [place], [place], [place], [place], [place], [place], [place], [place], [place], [place], [place], [place], [place], [place], Oat, Rye, Hops, [employer], [place], Saccharomycese Cerevisiae, [place], [place], Chicken, Tomato, White Potato, Sweet Potato, Green Pea, [employer], Mushroom, Avocado, Onion, Cabbage, Carrots, Celery, Corn, Cucumber, Grape, Orange, Banana, Apple, Peach, Strawberry, Cantaloupe, Watermelon , Pineapple, Chocolate, Karaya Gum, Cinnamon , Nutmeg, Ginger, Garlic, Black Pepper and Mustard - I do not think that an epinephrine auto-injectors is necessary.  - I am going to get a serum tryptase to rule out mast cell disease. - DEFINITELY keep all cow's milk products out of your diet to see if this helps. - We can recheck alpha gal level again at the next visit to see where it is trending.  - We do work with a Registered Dietician if you would like more information on managing Rhonda Sullivan's diet with food allergies.  2. Return in about 3 months (around 02/08/2020). This can be an in-person, a virtual Webex or a telephone follow up visit.   Please inform us of any Emergency Department visits, hospitalizations, or changes in symptoms. Call us before going to the ED for breathing or allergy symptoms since we might be able to fit you in for a sick visit. Feel free to contact us anytime with any questions, problems, or concerns.  It was a pleasure to meet you today! I hope we get to the bottom of this!   Websites that have reliable patient information: 1. American Academy of Asthma, Allergy, and Immunology: www.aaaai.org 2. Food Allergy Research and Education (FARE): foodallergy.org 3. Mothers of Asthmatics:  http://www.asthmacommunitynetwork.org 4. American College of Allergy, Asthma, and Immunology: www.acaai.org   COVID-19 Vaccine Information can be found at: ShippingScam.co.uk For questions related to vaccine distribution or appointments, please email [email or web address] or call [phone].     "Like" [place] on Facebook and Instagram for our latest updates!        Make sure you are registered to vote! If you have moved or changed any of your contact information, you will need to get this updated before voting!  In some cases, you MAY be able to register to vote online: [email or web address]

## 2019-11-08 NOTE — Progress Notes (Signed)
NEW PATIENT  Date of Service/Encounter:  11/08/19  Referring provider: Sharilyn Sites, MD   Assessment:   Anaphylactic shock due to food - clear alpha gal diagnosis with positive to milk and Arabic gum today as well (unclear significance on the latter two)  Celiac disease  Plan/Recommendations:   1. Abdominal pain with alpha gal syndrome and Celiac - Testing was positive to: Milk, Beef, Lamb, Acacia (Arabic Gum) - Avoid the above foods for now.  - Testing was negative to Peanut, Soy, Wheat, Sesame, Egg, Casein, Shellfish Mix , Fish Mix, Cashew, [place], Walnut, [place], [place], [place] nut, [place], [place], [place], [place], [place], [place], [place], [place], [place], [place], [place], [place], [place], [place], [place], Oat, Rye, Hops, [employer], [place], Saccharomycese Cerevisiae, [place], [place], Chicken, Tomato, White Potato, Sweet Potato, Green Pea, [employer], Mushroom, Avocado, Onion, Cabbage, Carrots, Celery, Corn, Cucumber, Grape, Orange, Banana, Apple, Peach, Strawberry, Cantaloupe, Watermelon , Pineapple, Chocolate, Karaya Gum, Cinnamon , Nutmeg, Ginger, Garlic, Black Pepper and Mustard - I do not think that an epinephrine auto-injectors is necessary.  - I am going to get a serum tryptase to rule out mast cell disease. - DEFINITELY keep all cow's milk products out of your diet to see if this helps. - We can recheck alpha gal level again at the next visit to see where it is trending.  - We do work with a Registered Dietician if you would like more information on managing Rhonda Sullivan's diet with food allergies.  2. Return in about 3 months (around 02/08/2020).  Subjective:   Rhonda Sullivan is a 64 y.o. female presenting today for evaluation of  Chief Complaint  Patient presents with  . Allergic Rhinitis     alpha gal    Rhonda Sullivan has a history of the following: Patient Active Problem List   Diagnosis Date Noted  . Celiac disease 02/20/2019  . RUQ abdominal pain 11/15/2018  . Nausea  without vomiting 11/07/2018  . Abdominal pain, epigastric 11/07/2018    History obtained from: chart review and patient.  Rhonda Sullivan was referred by Sharilyn Sites, MD.     Rhonda Sullivan is a 64 y.o. female presenting for an evaluation of possible food allergies.  She was working as a Quarry manager. Last summer, she was having vomiting and diarrhea. She was out of work for four weeks in total. She reports that she was "very weak" and wanted to go and curl up in bed. She was very still and would have these "spells". They were close together for four weeks in total.   She saw Dr. Laural Golden here in [place]. She was diagnosed with Celiac disease. She went on a strict no gluten diet. She had gone back to work at this time and they were not as close together. They kept lessening up but by the spring of 2021, she knew that there was likely something else. She did have a colonoscopy a few years back.   She was eventually diagnosed with alpha gal syndrome in early spring (IgE was 4.40). She has avoided all of this and has continued to have symptoms. She has had two more bites since that time.  She has been on a strict diet since April with continued symptoms. She is not avoiding milk and milk products at this point in time. She has been very strict on this, but she still gets "bouts" of nausea where she has spells. In total, the spells last around 5 hours. Prior to this, it was lasting upwards of three  days. Now it is much less. They are lessening up in general. But in the meantime, she is interested in doing further evaluation of allergies.   She is eating little cheese, but it did not seem to affect her. She is doing cottage cheese and cream. She is not using any gel capsules. She started on salmon oil due to high cholesterol. She has not had any reaction to it. She started that back this morning. She takes "loads" of vegan vitamins that are gluten free as well.   Her episodes at this point are very periodic. She went  six weeks between episodes. They can last from 3-4 hours or 2-3 days. She is now eating a lot of eggs, fruit, cottage cheese, chicken and more chicken, salads, broccoli or brussel sprouts.   Allergic Rhinitis Symptom History: She reports that 20 years ago, she had "poison oak in [her] eyes". She had allergy testing that demonstrated positives to molds and dust. She got a steroid injection 2-3 times. This was years ago. Her last steroid injection was 15+ years ago.   Otherwise, there is no history of other atopic diseases, including asthma, drug allergies, stinging insect allergies, eczema, urticaria or contact dermatitis. There is no significant infectious history. Vaccinations are up to date.    Past Medical History: Patient Active Problem List   Diagnosis Date Noted  . Celiac disease 02/20/2019  . RUQ abdominal pain 11/15/2018  . Nausea without vomiting 11/07/2018  . Abdominal pain, epigastric 11/07/2018    Medication List:  Allergies as of 11/08/2019      Reactions   Other Other (See Comments)   Metal- body rejects it       Medication List       Accurate as of November 08, 2019 11:59 PM. If you have any questions, ask your nurse or doctor.        STOP taking these medications   omeprazole 20 MG capsule Commonly known as: PRILOSEC Stopped by: Valentina Shaggy, MD   oxybutynin 10 MG 24 hr tablet Commonly known as: DITROPAN-XL Stopped by: Valentina Shaggy, MD     TAKE these medications   Accu-Chek Guide test strip Generic drug: glucose blood USE TO TEST TWICE A DAY   ondansetron 4 MG tablet Commonly known as: ZOFRAN ondansetron HCl 4 mg tablet  TAKE 1 TABLET BY MOUTH EVERY 8 HOURS AS NEEDED FOR NAUSEA AND VOMITING What changed: Another medication with the same name was removed. Continue taking this medication, and follow the directions you see here. Changed by: Valentina Shaggy, MD   pantoprazole 40 MG tablet Commonly known as: Protonix Take 1 tablet (40  mg total) by mouth daily.   V-R MAGNESIUM 250 MG Tabs Generic drug: Magnesium Take by mouth.   Vitamin D (Ergocalciferol) 1.25 MG (50000 UNIT) Caps capsule Commonly known as: DRISDOL Take 1 capsule (50,000 Units total) by mouth every 7 (seven) days.       Birth History: non-contributory  Developmental History: non-contributory  Past Surgical History: Past Surgical History:  Procedure Laterality Date  . ABDOMINAL HYSTERECTOMY    . BIOPSY  12/06/2018   Procedure: BIOPSY;  Surgeon: Rogene Houston, MD;  [facility];  Service: Endoscopy;;  Duodenal Biopsies fopr celiac disease   . ESOPHAGOGASTRODUODENOSCOPY N/A 12/06/2018   Procedure: ESOPHAGOGASTRODUODENOSCOPY (EGD);  Surgeon: Rogene Houston, MD;  [facility];  Service: Endoscopy;  Laterality: N/A;  3:00  . POLYPECTOMY  12/06/2018   Procedure: POLYPECTOMY;  Surgeon: Rogene Houston, MD;  [facility];  Service: Endoscopy;;  gastric polyps biopsy      Family History: Family History  Problem Relation Age of Onset  . Allergic rhinitis Mother   . Asthma Neg Hx   . Eczema Neg Hx   . 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 She does not have any pets. There is no smoking exposure in her home.     Review of Systems  Constitutional: Negative.  Negative for chills, fever, malaise/fatigue and weight loss.  HENT: Negative.  Negative for congestion, ear discharge, ear pain, sinus pain and sore throat.   Eyes: Negative for pain, discharge and redness.  Respiratory: Negative for cough, sputum production, shortness of breath and wheezing.   Cardiovascular: Negative.  Negative for chest pain and palpitations.  Gastrointestinal: Positive for abdominal pain and nausea. Negative for constipation, diarrhea, heartburn and vomiting.  Skin: Negative.  Negative for itching and rash.  Neurological: Negative for dizziness and headaches.  Endo/Heme/Allergies: Negative for environmental  allergies. Does not bruise/bleed easily.       Objective:   Blood pressure 112/70, pulse 68, temperature 98.2 F (36.8 C), temperature source Temporal, resp. rate 16, height 5' 3.1" (1.603 m), weight 145 lb 3.2 oz (65.9 kg), SpO2 98 %. Body mass index is 25.64 kg/m.   Physical Exam:   Physical Exam Constitutional:      Appearance: She is well-developed.  HENT:     Head: Normocephalic and atraumatic.     Right Ear: Tympanic membrane, ear canal and external ear normal. No drainage, swelling or tenderness. Tympanic membrane is not injected, scarred, erythematous, retracted or bulging.     Left Ear: Tympanic membrane, ear canal and external ear normal. No drainage, swelling or tenderness. Tympanic membrane is not injected, scarred, erythematous, retracted or bulging.     Nose: No nasal deformity, septal deviation, mucosal edema or rhinorrhea.     Right Turbinates: Enlarged and swollen.     Left Turbinates: Enlarged and swollen.     Right Sinus: No maxillary sinus tenderness or frontal sinus tenderness.     Left Sinus: No maxillary sinus tenderness or frontal sinus tenderness.     Mouth/Throat:     Mouth: Mucous membranes are not pale and not dry.     Pharynx: Uvula midline.  Eyes:     General:        Right eye: No discharge.        Left eye: No discharge.     Conjunctiva/sclera: Conjunctivae normal.     Right eye: Right conjunctiva is not injected. No chemosis.    Left eye: Left conjunctiva is not injected. No chemosis.    Pupils: Pupils are equal, round, and reactive to light.  Cardiovascular:     Rate and Rhythm: Normal rate and regular rhythm.     Heart sounds: Normal heart sounds.  Pulmonary:     Effort: Pulmonary effort is normal. No tachypnea, accessory muscle usage or respiratory distress.     Breath sounds: Normal breath sounds. No wheezing, rhonchi or rales.     Comments: Moving air well in all lung fields.  Chest:     Chest wall: No tenderness.  Abdominal:      Tenderness: There is no abdominal tenderness. There is no guarding or rebound.  Lymphadenopathy:     Head:     Right side of head: No submandibular, tonsillar or occipital adenopathy.  Left side of head: No submandibular, tonsillar or occipital adenopathy.     Cervical: No cervical adenopathy.  Skin:    Coloration: Skin is not pale.     Findings: No abrasion, erythema, petechiae or rash. Rash is not papular, urticarial or vesicular.     Comments: No eczematous or urticarial lesions noted.   Neurological:     Mental Status: She is alert.      Diagnostic studies:   Allergy Studies:    Food Adult Perc - 11/08/19 1000    Time Antigen Placed 1030    Allergen Manufacturer Lavella Hammock    Location Back     Control-buffer 50% Glycerol Negative    Control-Histamine 1 mg/ml 3+    1. Peanut Negative    2. Soybean Negative    3. Wheat Negative    4. Sesame Negative    5. Milk, cow --   +/-   6. Egg White, Chicken Negative    7. Casein Negative    8. Shellfish Mix Negative    9. Fish Mix Negative    10. Cashew Negative    11. Pecan Food Negative    12. [place] Negative    13. Almond Negative    14. Hazelnut Negative    15. [place] nut Negative    16. Coconut Negative    17. Pistachio Negative    18. Catfish Negative    19. Bass Negative    20. Trout Negative    21. Tuna Negative    22. Salmon Negative    23. Flounder Negative    24. Codfish Negative    25. Shrimp Negative    26. Crab Negative    27. Lobster Negative    28. Oyster Negative    29. Scallops Negative    30. Barley Negative    31. Oat  Negative    32. Rye  Negative    33. Hops Negative    34. Rice Negative    35. Cottonseed Negative    36. Saccharomyces Cerevisiae  Negative    37. Pork Negative    38. [place] Meat Negative    39. Chicken Meat Negative    40. Beef 2+    41. Lamb 2+    42. Tomato Negative    43. White Potato Negative    44. Sweet Potato Negative    45. Pea, Green/English Negative     46. Navy Bean Negative    47. Mushrooms Negative    48. Avocado Negative    49. Onion Negative    50. Cabbage Negative    51. Carrots Negative    52. Celery Negative    53. Corn Negative    54. Cucumber Negative    55. Grape (White seedless) Negative    56. Orange  Negative    57. Banana Negative    58. Apple Negative    59. Peach Negative    60. Strawberry Negative    61. Cantaloupe Negative    62. Watermelon Negative    63. Pineapple Negative    64. Chocolate/Cacao bean Negative    65. Karaya Gum Negative    66. Acacia (Arabic Gum) --   +/-   67. Cinnamon Negative    68. Nutmeg Negative    69. Ginger Negative    70. Garlic Negative    71. Pepper, black Negative    72. Mustard Negative           Allergy testing results were read  and interpreted by myself, documented by clinical staff.         Salvatore Marvel, MD [facility] [place] [facility]

## 2019-11-10 ENCOUNTER — Encounter: Payer: Self-pay | Admitting: Allergy & Immunology

## 2019-11-13 ENCOUNTER — Ambulatory Visit (HOSPITAL_COMMUNITY): Payer: BC Managed Care – PPO | Attending: Physical Medicine and Rehabilitation | Admitting: Physical Therapy

## 2019-11-13 ENCOUNTER — Encounter (HOSPITAL_COMMUNITY): Payer: Self-pay | Admitting: Physical Therapy

## 2019-11-13 ENCOUNTER — Other Ambulatory Visit: Payer: Self-pay

## 2019-11-13 DIAGNOSIS — M79605 Pain in left leg: Secondary | ICD-10-CM | POA: Diagnosis present

## 2019-11-13 DIAGNOSIS — M5416 Radiculopathy, lumbar region: Secondary | ICD-10-CM

## 2019-11-13 NOTE — Therapy (Signed)
[place], [place], [address] Phone: [phone]   Fax:  [phone]  Physical Therapy Evaluation  Patient Details  Name: Rhonda Sullivan MRN: [id number] Date of Birth: [birth year] Referring Provider (PT): Suella Broad MD    Encounter Date: 11/13/2019   PT End of Session - 11/13/19 0942    Visit Number 1    Number of Visits 8    Date for PT Re-Evaluation 12/13/19    Authorization Type BCBS COMM    Progress Note Due on Visit 8    PT Start Time 0905    PT Stop Time 0945    PT Time Calculation (min) 40 min    Activity Tolerance Patient tolerated treatment well    Behavior During Therapy [facility] for tasks assessed/performed           Past Medical History:  Diagnosis Date  . Angio-edema   . GERD (gastroesophageal reflux disease)     Past Surgical History:  Procedure Laterality Date  . ABDOMINAL HYSTERECTOMY    . BIOPSY  12/06/2018   Procedure: BIOPSY;  Surgeon: Rogene Houston, MD;  [facility];  Service: Endoscopy;;  Duodenal Biopsies fopr celiac disease   . ESOPHAGOGASTRODUODENOSCOPY N/A 12/06/2018   Procedure: ESOPHAGOGASTRODUODENOSCOPY (EGD);  Surgeon: Rogene Houston, MD;  [facility];  Service: Endoscopy;  Laterality: N/A;  3:00  . POLYPECTOMY  12/06/2018   Procedure: POLYPECTOMY;  Surgeon: Rogene Houston, MD;  [facility];  Service: Endoscopy;;  gastric polyps biopsy     There were no vitals filed for this visit.    Subjective Assessment - 11/13/19 0916    Subjective Patient presents to physical therapy with complaint of LT radiculopathy. Patient says she has been having pain in her LT hip thigh and down leg beginning about 6 months ago. Says this has gotten progressively worse. Patient says she had injection which was not helpful. Patient had MRI which showed lumbar spondylolisthesis at L4-5, and spinal stenosis. Patient denies current medication.    Limitations  Lifting;House hold activities;Standing;Walking    Diagnostic tests MRI    Patient Stated Goals No pain    Currently in Pain? Yes    Pain Score 5     Pain Location Leg    Pain Orientation Left;Posterior    Pain Descriptors / Indicators Aching    Pain Type Acute pain    Pain Radiating Towards LT calf    Pain Onset More than a month ago    Pain Frequency Constant    Aggravating Factors  bending, lifting, standing    Pain Relieving Factors elevating leg, lay down on back    Effect of Pain on Daily Activities Limits              [facility] PT Assessment - 11/13/19 0001      Assessment   Medical Diagnosis Lumabr spinal stenosis     Referring Provider (PT) Suella Broad MD     Onset Date/Surgical Date --   6 months   Next MD Visit --   None scheduled    Prior Therapy No       Precautions   Precautions None      Restrictions   Weight Bearing Restrictions No      Balance Screen   Has the patient fallen in the past 6 months No      [place] residence    Living  Arrangements Spouse/significant other      Prior Function   Level of Independence Independent    Vocation Part time employment   CNA      Cognition   Overall Cognitive Status Within Functional Limits for tasks assessed      Observation/Other Assessments   Focus on Therapeutic Outcomes (FOTO)  48% limited       ROM / Strength   AROM / PROM / Strength AROM;Strength      AROM   AROM Assessment Site Lumbar    Lumbar Flexion 50% limited   increased LLE pain    Lumbar Extension 50% limited     Lumbar - Right Side Bend WFL     Lumbar - Left Side Bend WFL     Lumbar - Right Rotation 25% limited     Lumbar - Left Rotation 25% limited    LLE pain      Strength   Strength Assessment Site Hip;Knee    Right/Left Hip Right;Left    Right Hip Flexion 5/5    Left Hip Flexion 4+/5   pain   Right/Left Knee Right;Left    Right Knee Extension 5/5    Left Knee Extension 4/5   pain      Transfers   Five time sit to stand comments  13.4 sec with no UE       Ambulation/Gait   Ambulation/Gait Yes    Ambulation/Gait Assistance 7: Independent    Assistive device None    Gait Pattern Decreased stance time - left;Decreased step length - left;Antalgic    Ambulation Surface Level;Indoor                      Objective measurements completed on examination: See above findings.               PT Education - 11/13/19 0919    Education Details on evaluation findings, POC and HEP    Person(s) Educated Patient    Methods Explanation    Comprehension Verbalized understanding            PT Short Term Goals - 11/13/19 1148      PT SHORT TERM GOAL #1   Title Patient will be independent with initial HEP and self-management strategies to improve functional outcomes             PT Long Term Goals - 11/13/19 1148      PT LONG TERM GOAL #1   Title Patient will improve FOTO score by 10% to indicate improvement in functional outcomes    Time 4    Period Weeks    Status New    Target Date 12/13/19      PT LONG TERM GOAL #2   Title Patient will report at least 60% overall improvement in subjective complaint to indicate improvement in ability to perform ADLs.    Time 4    Period Weeks    Status New    Target Date 12/13/19      PT LONG TERM GOAL #3   Title Patient will improve lumbar AROM by 25% in all restricted planes for improved ability to perform functional mobility tasks and ADLs.    Time 4    Period Weeks    Status New    Target Date 12/13/19                  Plan - 11/13/19 0943    Clinical Impression Statement Patient is a 64  y.o. female who presents to physical therapy with complaint of LT lumbar radiculopathy. Patient demonstrates decreased strength, ROM restriction, and gait abnormalities which are likely contributing to symptoms of pain and are negatively impacting patient ability to perform ADLs and functional mobility tasks.  Patient will benefit from skilled physical therapy services to address these deficits to reduce pain, and improve level of function with ADLs, and functional mobility tasks    Examination-Activity Limitations Lift;Stand;Locomotion Level;Bend;Stairs;Squat    Examination-Participation Restrictions Cleaning;Community Activity;Dorita Sciara    Stability/Clinical Decision Making Stable/Uncomplicated    Clinical Decision Making Low    Rehab Potential Good    PT Frequency 2x / week    PT Duration 4 weeks    PT Treatment/Interventions ADLs/Self Care Home Management;Aquatic Therapy;Biofeedback;Cryotherapy;Ultrasound;Parrafin;Fluidtherapy;Therapeutic activities;Patient/family education;Manual techniques;Manual lymph drainage;Splinting;Energy conservation;Spinal Manipulations;Dry needling;Passive range of motion;Scar mobilization;Compression bandaging;Taping;Joint Manipulations;Vasopneumatic Device;Orthotic Fit/Training;Neuromuscular re-education;Balance training;Therapeutic exercise;Gait training;DME Instruction;Contrast Bath;Electrical Stimulation;Iontophoresis 107m/ml Dexamethasone;Moist Heat;Traction;Functional mobility training;Stair training    PT Next Visit Plan Review goals and HEP. Progress HEP to include glute set, gentle lumbar mobility. Assess response to table flexion (SKTC, DKTC) stretching    PT Home Exercise Plan 8//1/21: ab set    Consulted and Agree with Plan of Care Patient           Patient will benefit from skilled therapeutic intervention in order to improve the following deficits and impairments:  Abnormal gait, Pain, Improper body mechanics, Decreased mobility, Decreased range of motion, Decreased strength, Hypomobility, Impaired flexibility, Decreased activity tolerance  Visit Diagnosis: Radiculopathy, lumbar region  Pain in left leg     Problem List Patient Active Problem List   Diagnosis Date Noted  . Celiac disease 02/20/2019  . RUQ abdominal pain 11/15/2018  .  Nausea without vomiting 11/07/2018  . Abdominal pain, epigastric 11/07/2018    11:52 AM, 11/13/19 CJosue HectorPT DPT  [facility] [phone] [address] [address]Phone: [phone]  Fax:  [phone] Name: DDANIEL JOHNDROWMRN: [id number] of Birth: 1April 02, 1957

## 2019-11-16 LAB — F297-IGE ACACIA GUM: F297-IgE Acacia Gum: <0.1 kU/L

## 2019-11-16 LAB — FANA STAINING PATTERNS: Homogeneous Pattern: 1:320 {titer} — ABNORMAL HIGH

## 2019-11-16 LAB — ALLERGEN GUM CARAGEENAN
Carageenan Gum, IgE: <0.35 kU/L (ref <0.35)
Class Interpretation: 0

## 2019-11-16 LAB — ANTINUCLEAR ANTIBODIES, IFA: ANA Titer 1: POSITIVE — AB

## 2019-11-16 LAB — XANTHAN GUM IGE
Class Interpretation: 0
Xanthan Gum, IgE*: <0.35 kU/L (ref <0.35)

## 2019-11-16 LAB — C-REACTIVE PROTEIN: CRP: <1 mg/L (ref 0–10)

## 2019-11-16 LAB — SEDIMENTATION RATE: Sed Rate: 4 mm/hr (ref 0–40)

## 2019-11-16 LAB — TRYPTASE: Tryptase: 5.2 ug/L (ref 2.2–13.2)

## 2019-11-20 ENCOUNTER — Ambulatory Visit (INDEPENDENT_AMBULATORY_CARE_PROVIDER_SITE_OTHER): Payer: BC Managed Care – PPO | Admitting: Gastroenterology

## 2019-11-21 ENCOUNTER — Encounter (HOSPITAL_COMMUNITY): Payer: Self-pay | Admitting: Physical Therapy

## 2019-11-21 ENCOUNTER — Ambulatory Visit (HOSPITAL_COMMUNITY): Payer: BC Managed Care – PPO | Admitting: Physical Therapy

## 2019-11-21 ENCOUNTER — Other Ambulatory Visit: Payer: Self-pay

## 2019-11-21 DIAGNOSIS — M5416 Radiculopathy, lumbar region: Secondary | ICD-10-CM | POA: Diagnosis not present

## 2019-11-21 DIAGNOSIS — M79605 Pain in left leg: Secondary | ICD-10-CM

## 2019-11-21 NOTE — Patient Instructions (Addendum)
Knee to Chest    Lying supine, bend involved knee to chest _3__ times. Repeat with other leg. Do 2___ times per day.  Hold 30 secinds Hamstring Stretch: Active    Support behind right knee. Starting with knee bent, attempt to straighten knee until a comfortable stretch is felt in back of thigh. Hold _20___ seconds. Repeat _3___ times per set. Do _1___ sets per session. Do _2PELVIC STABILIZATION: Basic Bridge    Exhaling, lift hips. Hold for _3__ breaths. Exhaling, release hips back to floor. Repeat _10__ times. Do _2__ times per day.  Copyright  VHI. All rights reserved.  ___ sessions per day.  http://orth.exer.us/159   Copyright  VHI. All rights reserved.    Copyright  VHI. All rights reserved.  Getting Into / Out of Bed    Lower self to lie down on one side by raising legs and lowering head at the same time. Use arms to assist moving without twisting. Bend both knees to roll onto back if desired. To sit up, start from lying on side, and use same move-ments in reverse. Keep trunk aligned with legs.   Copyright  VHI. All rights reserved.  Trunk Extension    Standing, place back of open hands on low back. Straighten spine then arch the back and move shoulders back. Repeat __5__ times per session. Do 7____ sessions per week. Variation: Seated  Copyright  VHI. All rights reserved.

## 2019-11-21 NOTE — Progress Notes (Signed)
Referral placed to [facility] for review and scheduling.  Their office will contact the patient. If denied they will send [place] a notification.  Thanks

## 2019-11-21 NOTE — Therapy (Signed)
[place], [place], [address] Phone: [phone]   Fax:  [phone]  Physical Therapy Treatment  Patient Details  Name: Rhonda Sullivan MRN: [id number] Date of Birth: 1956-02-13 Referring Provider (PT): Suella Broad MD    Encounter Date: 11/21/2019   PT End of Session - 11/21/19 1424    Visit Number 2    Number of Visits 8    Date for PT Re-Evaluation 12/13/19    Authorization Type BCBS COMM    Progress Note Due on Visit 8    PT Start Time 1403    PT Stop Time 1445    PT Time Calculation (min) 42 min    Activity Tolerance Patient tolerated treatment well    Behavior During Therapy [facility] for tasks assessed/performed           Past Medical History:  Diagnosis Date  . Angio-edema   . GERD (gastroesophageal reflux disease)     Past Surgical History:  Procedure Laterality Date  . ABDOMINAL HYSTERECTOMY    . BIOPSY  12/06/2018   Procedure: BIOPSY;  Surgeon: Rogene Houston, MD;  [facility];  Service: Endoscopy;;  Duodenal Biopsies fopr celiac disease   . ESOPHAGOGASTRODUODENOSCOPY N/A 12/06/2018   Procedure: ESOPHAGOGASTRODUODENOSCOPY (EGD);  Surgeon: Rogene Houston, MD;  [facility];  Service: Endoscopy;  Laterality: N/A;  3:00  . POLYPECTOMY  12/06/2018   Procedure: POLYPECTOMY;  Surgeon: Rogene Houston, MD;  [facility];  Service: Endoscopy;;  gastric polyps biopsy     There were no vitals filed for this visit.   Subjective Assessment - 11/21/19 1411    Subjective PT states that she is slightly sore from the Ab set but she has been doing them.    Limitations Lifting;House hold activities;Standing;Walking    Diagnostic tests MRI    Patient Stated Goals No pain    Currently in Pain? Yes    Pain Score 3     Pain Location Back    Pain Orientation Left    Pain Descriptors / Indicators Aching    Pain Type Acute pain    Pain Radiating Towards to hip    Pain Onset  More than a month ago    Pain Frequency Constant    Aggravating Factors  bending, lifting , standing    Pain Relieving Factors laying down    Effect of Pain on Daily Activities limits                             [facility] Adult PT Treatment/Exercise - 11/21/19 0001      Exercises   Exercises Lumbar      Lumbar Exercises: Stretches   Active Hamstring Stretch 3 reps;30 seconds    Single Knee to Chest Stretch 3 reps;20 seconds    Lower Trunk Rotation 5 reps    Standing Extension 5 reps      Lumbar Exercises: Supine   Ab Set 10 reps    Clam 10 reps    Bent Knee Raise 10 reps    Bridge 10 reps                  PT Education - 11/21/19 1420    Education Details Bed mobility, reviewed goals    Person(s) Educated Patient    Methods Explanation    Comprehension Verbalized understanding;Returned demonstration  PT Short Term Goals - 11/21/19 1410      PT SHORT TERM GOAL #1   Title Patient will be independent with initial HEP and self-management strategies to improve functional outcomes    Status On-going             PT Long Term Goals - 11/21/19 1410      PT LONG TERM GOAL #1   Title Patient will improve FOTO score by 10% to indicate improvement in functional outcomes    Time 4    Period Weeks    Status On-going      PT LONG TERM GOAL #2   Title Patient will report at least 60% overall improvement in subjective complaint to indicate improvement in ability to perform ADLs.    Time 4    Period Weeks    Status On-going      PT LONG TERM GOAL #3   Title Patient will improve lumbar AROM by 25% in all restricted planes for improved ability to perform functional mobility tasks and ADLs.    Time 4    Period Weeks    Status On-going                 Plan - 11/21/19 1424    Clinical Impression Statement Evaluation and goasl reviewed with pt.  PT instructed in less stressful way to get into and out of her bed.  Began lumbar  stretches as well as starting beginning lumbar stabilization exercises.    Examination-Activity Limitations Lift;Stand;Locomotion Level;Bend;Stairs;Squat    Examination-Participation Restrictions Cleaning;Community Activity;[facility]    Stability/Clinical Decision Making Stable/Uncomplicated    Clinical Decision Making Low    Rehab Potential Good    PT Frequency 2x / week    PT Duration 4 weeks    PT Next Visit Plan Progress HEP    PT Home Exercise Plan 8//1/21: ab set; bed mobility, back extension, knee to chest, hamstring stretch, bridge.           Patient will benefit from skilled therapeutic intervention in order to improve the following deficits and impairments:  Abnormal gait, Pain, Improper body mechanics, Decreased mobility, Decreased range of motion, Decreased strength, Hypomobility, Impaired flexibility, Decreased activity tolerance  Visit Diagnosis: Radiculopathy, lumbar region  Pain in left leg     Problem List Patient Active Problem List   Diagnosis Date Noted  . Celiac disease 02/20/2019  . RUQ abdominal pain 11/15/2018  . Nausea without vomiting 11/07/2018  . Abdominal pain, epigastric 11/07/2018    Rayetta Humphrey, PT CLT [phone] 11/21/2019, 2:46 PM  [place] [address] [place], [place], [address] Phone: [phone]   Fax:  [phone]  Name: Rhonda Sullivan MRN: [id number] Date of Birth: [birth year]

## 2019-11-25 ENCOUNTER — Encounter: Payer: Self-pay | Admitting: Allergy & Immunology

## 2019-11-26 ENCOUNTER — Encounter (HOSPITAL_COMMUNITY): Payer: Self-pay | Admitting: Physical Therapy

## 2019-11-26 ENCOUNTER — Ambulatory Visit (HOSPITAL_COMMUNITY): Payer: BC Managed Care – PPO | Admitting: Physical Therapy

## 2019-11-26 ENCOUNTER — Other Ambulatory Visit: Payer: Self-pay

## 2019-11-26 DIAGNOSIS — M5416 Radiculopathy, lumbar region: Secondary | ICD-10-CM | POA: Diagnosis not present

## 2019-11-26 DIAGNOSIS — M79605 Pain in left leg: Secondary | ICD-10-CM

## 2019-11-26 NOTE — Telephone Encounter (Signed)
Dr Ernst Bowler it looks like she is scheduled for 05/06/20 with Dr Estanislado Pandy.  It looks like they called the patient to scheduled yesterday afternoon. Do you want me to refer her to another practice?

## 2019-11-26 NOTE — Patient Instructions (Signed)
Access Code: [email or web address] URL: [email or web address] Date: 11/26/2019 Prepared by: [facility] Merritt Kibby  Exercises [employer] Up - 5 x daily - 7 x weekly - 2 sets - 10 reps - 2 second hold

## 2019-11-26 NOTE — Therapy (Signed)
[place], [place], [address] Phone: [phone]   Fax:  [phone]  Physical Therapy Treatment  Patient Details  Name: Rhonda Sullivan MRN: [id number] Date of Birth: 1955-04-06 Referring Provider (PT): Suella Broad MD    Encounter Date: 11/26/2019   PT End of Session - 11/26/19 1430    Visit Number 3    Number of Visits 8    Date for PT Re-Evaluation 12/13/19    Authorization Type BCBS COMM    Progress Note Due on Visit 8    PT Start Time 1430    PT Stop Time 1515    PT Time Calculation (min) 45 min    Activity Tolerance Patient tolerated treatment well    Behavior During Therapy [facility] for tasks assessed/performed           Past Medical History:  Diagnosis Date   Angio-edema    GERD (gastroesophageal reflux disease)     Past Surgical History:  Procedure Laterality Date   ABDOMINAL HYSTERECTOMY     BIOPSY  12/06/2018   Procedure: BIOPSY;  Surgeon: Rogene Houston, MD;  [facility];  Service: Endoscopy;;  Duodenal Biopsies fopr celiac disease    ESOPHAGOGASTRODUODENOSCOPY N/A 12/06/2018   Procedure: ESOPHAGOGASTRODUODENOSCOPY (EGD);  Surgeon: Rogene Houston, MD;  [facility];  Service: Endoscopy;  Laterality: N/A;  3:00   POLYPECTOMY  12/06/2018   Procedure: POLYPECTOMY;  Surgeon: Rogene Houston, MD;  [facility];  Service: Endoscopy;;  gastric polyps biopsy     There were no vitals filed for this visit.   Subjective Assessment - 11/26/19 1429    Subjective Patient states Saturday was the first day she was able to the exercises. Sunday her back was hurting really bad. She did not have much leg pain that day. Some days are better than others.    Limitations Lifting;House hold activities;Standing;Walking    Diagnostic tests MRI    Patient Stated Goals No pain    Currently in Pain? Yes    Pain Score 4     Pain Location Leg    Pain Orientation Left    Pain  Onset More than a month ago                             [facility] Adult PT Treatment/Exercise - 11/26/19 0001      Lumbar Exercises: Stretches   Single Knee to Chest Stretch 3 reps;20 seconds    Prone on Elbows Stretch 5 reps;10 seconds    Prone on Elbows Stretch Limitations 2 sets     Press Ups 10 reps    Press Ups Limitations 2 sets      Lumbar Exercises: Supine   Ab Set 5 reps;5 seconds    Glut Set 5 reps;5 seconds    Bent Knee Raise 10 reps    Bridge 10 reps    Bridge Limitations 2 sets with glute set    Other Supine Lumbar Exercises hip isometrics 10x 10 second holds abduction and adduction                   PT Education - 11/26/19 1429    Education Details Patient educated on HEP, exercise mechanics    Person(s) Educated Patient    Methods Explanation;Demonstration    Comprehension Verbalized understanding;Returned demonstration            PT  Short Term Goals - 11/21/19 1410      PT SHORT TERM GOAL #1   Title Patient will be independent with initial HEP and self-management strategies to improve functional outcomes    Status On-going             PT Long Term Goals - 11/21/19 1410      PT LONG TERM GOAL #1   Title Patient will improve FOTO score by 10% to indicate improvement in functional outcomes    Time 4    Period Weeks    Status On-going      PT LONG TERM GOAL #2   Title Patient will report at least 60% overall improvement in subjective complaint to indicate improvement in ability to perform ADLs.    Time 4    Period Weeks    Status On-going      PT LONG TERM GOAL #3   Title Patient will improve lumbar AROM by 25% in all restricted planes for improved ability to perform functional mobility tasks and ADLs.    Time 4    Period Weeks    Status On-going                 Plan - 11/26/19 1430    Clinical Impression Statement Patient able to achieve good TRA activation with minimal verbal cueing and is able to maintain  with marches. Patient able to complete bridge with glute set today for emphasis on glute strengthening. Patient stated increase in low back symptoms and minimally decreased LE symptoms following. Patient completes hip isometrics without increase in symptoms. Patient states decrease in LE symptoms with prone on elbows but increased symptoms in low back indicating centralization of symptoms. She continues to have reduction in LLE with press ups and increase in low back. Following second round of press ups she experiences elimination of LE symptoms and decrease in low back symptom while still lying in prone. She experiences return in symptoms following return to seated EOB.  Patient educated on dosing of press up exercise. Patient will continue to benefit from skilled physical therapy in order to reduce impairment and improve function.    Examination-Activity Limitations Lift;Stand;Locomotion Level;Bend;Stairs;Squat    Examination-Participation Restrictions Cleaning;Community Activity;Yard Work;Laundry    Stability/Clinical Decision Making Stable/Uncomplicated    Rehab Potential Good    PT Frequency 2x / week    PT Duration 4 weeks    PT Next Visit Plan Progress HEP; assess response to extension based exercises and discontinue if not helpful    PT Home Exercise Plan 8//1/21: ab set; bed mobility, back extension, knee to chest, hamstring stretch, bridge. 8/24 press up           Patient will benefit from skilled therapeutic intervention in order to improve the following deficits and impairments:  Abnormal gait, Pain, Improper body mechanics, Decreased mobility, Decreased range of motion, Decreased strength, Hypomobility, Impaired flexibility, Decreased activity tolerance  Visit Diagnosis: Radiculopathy, lumbar region  Pain in left leg     Problem List Patient Active Problem List   Diagnosis Date Noted   Celiac disease 02/20/2019   RUQ abdominal pain 11/15/2018   Nausea without vomiting  11/07/2018   Abdominal pain, epigastric 11/07/2018    3:20 PM, 11/26/19 Mearl Latin PT, DPT Physical Therapist at [place], [place], [address] Phone: [phone]   Fax:  [phone]  Name: THURMA PRIEGO MRN: [id number]  Date of Birth: 1955-10-19

## 2019-11-28 ENCOUNTER — Other Ambulatory Visit: Payer: Self-pay

## 2019-11-28 ENCOUNTER — Ambulatory Visit (HOSPITAL_COMMUNITY): Payer: BC Managed Care – PPO

## 2019-11-28 ENCOUNTER — Encounter (HOSPITAL_COMMUNITY): Payer: Self-pay

## 2019-11-28 DIAGNOSIS — M5416 Radiculopathy, lumbar region: Secondary | ICD-10-CM

## 2019-11-28 DIAGNOSIS — M79605 Pain in left leg: Secondary | ICD-10-CM

## 2019-11-28 NOTE — Therapy (Signed)
[place], [place], [address] Phone: [phone]   Fax:  [phone]  Physical Therapy Treatment  Patient Details  Name: Rhonda Sullivan MRN: [id number] Date of Birth: 03/28/1956 Referring Provider (PT): Suella Broad MD    Encounter Date: 11/28/2019   PT End of Session - 11/28/19 1349    Visit Number 4    Number of Visits 8    Date for PT Re-Evaluation 12/13/19    Authorization Type BCBS COMM    Progress Note Due on Visit 8    PT Start Time 3435    PT Stop Time 1428    PT Time Calculation (min) 39 min    Activity Tolerance Patient tolerated treatment well    Behavior During Therapy [facility] for tasks assessed/performed           Past Medical History:  Diagnosis Date  . Angio-edema   . GERD (gastroesophageal reflux disease)     Past Surgical History:  Procedure Laterality Date  . ABDOMINAL HYSTERECTOMY    . BIOPSY  12/06/2018   Procedure: BIOPSY;  Surgeon: Rogene Houston, MD;  [facility];  Service: Endoscopy;;  Duodenal Biopsies fopr celiac disease   . ESOPHAGOGASTRODUODENOSCOPY N/A 12/06/2018   Procedure: ESOPHAGOGASTRODUODENOSCOPY (EGD);  Surgeon: Rogene Houston, MD;  [facility];  Service: Endoscopy;  Laterality: N/A;  3:00  . POLYPECTOMY  12/06/2018   Procedure: POLYPECTOMY;  Surgeon: Rogene Houston, MD;  [facility];  Service: Endoscopy;;  gastric polyps biopsy     There were no vitals filed for this visit.   Subjective Assessment - 11/28/19 1352    Subjective Pt reports pain radiating down leg earlier today, but able to do exercises in HEP which centralized pain.    Limitations Lifting;House hold activities;Standing;Walking    Diagnostic tests MRI    Patient Stated Goals No pain    Currently in Pain? Yes    Pain Score 2     Pain Location Back    Pain Orientation Left    Pain Descriptors / Indicators Aching    Pain Type Acute pain    Pain Onset More than  a month ago    Pain Frequency Constant    Aggravating Factors  bending, lifting, standing    Pain Relieving Factors laying down    Effect of Pain on Daily Activities limits                  [facility] Adult PT Treatment/Exercise - 11/28/19 0001      Lumbar Exercises: Stretches   Lower Trunk Rotation 5 reps    Lower Trunk Rotation Limitations top leg extended    Prone on Elbows Stretch 10 seconds    Prone on Elbows Stretch Limitations 10 sets    Press Ups 10 reps      Lumbar Exercises: Supine   Ab Set 20 reps    AB Set Limitations cued with exhale    Bent Knee Raise 10 reps    Bent Knee Raise Limitations cues for exhale and core activation    Dead Bug 10 reps    Dead Bug Limitations with bent knees    Bridge 10 reps    Bridge Limitations 2 sets      Lumbar Exercises: Sidelying   Clam Both;10 reps      Lumbar Exercises: Prone   Straight Leg Raise 10 reps    Straight Leg Raises Limitations alternating  PT Short Term Goals - 11/21/19 1410      PT SHORT TERM GOAL #1   Title Patient will be independent with initial HEP and self-management strategies to improve functional outcomes    Status On-going             PT Long Term Goals - 11/21/19 1410      PT LONG TERM GOAL #1   Title Patient will improve FOTO score by 10% to indicate improvement in functional outcomes    Time 4    Period Weeks    Status On-going      PT LONG TERM GOAL #2   Title Patient will report at least 60% overall improvement in subjective complaint to indicate improvement in ability to perform ADLs.    Time 4    Period Weeks    Status On-going      PT LONG TERM GOAL #3   Title Patient will improve lumbar AROM by 25% in all restricted planes for improved ability to perform functional mobility tasks and ADLs.    Time 4    Period Weeks    Status On-going                 Plan - 11/28/19 1431    Clinical Impression Statement Pt tolerated new core  exercises with heavy verbal cues. Pt able to activate core separately from glute activation with increased time and cues with heavy mind-body connection. Pt with good results performing extension-based exercises. Slight increase in pain at EOS, but not unbearable. Continue to progress as able.    Examination-Activity Limitations Lift;Stand;Locomotion Level;Bend;Stairs;Squat    Examination-Participation Restrictions Cleaning;Community Activity;[facility]    Stability/Clinical Decision Making Stable/Uncomplicated    Rehab Potential Good    PT Frequency 2x / week    PT Duration 4 weeks    PT Treatment/Interventions ADLs/Self Care Home Management;Aquatic Therapy;Biofeedback;Cryotherapy;Ultrasound;Parrafin;Fluidtherapy;Therapeutic activities;Patient/family education;Manual techniques;Manual lymph drainage;Splinting;Energy conservation;Spinal Manipulations;Dry needling;Passive range of motion;Scar mobilization;Compression bandaging;Taping;Joint Manipulations;Vasopneumatic Device;Orthotic Fit/Training;Neuromuscular re-education;Balance training;Therapeutic exercise;Gait training;DME Instruction;Contrast Bath;Electrical Stimulation;Iontophoresis 50m/ml Dexamethasone;Moist Heat;Traction;Functional mobility training;Stair training    PT Next Visit Plan Continue core strengthening, extension based due to good results.    PT Home Exercise Plan 8//1/21: ab set; bed mobility, back extension, knee to chest, hamstring stretch, bridge. 8/24 press up    Consulted and Agree with Plan of Care Patient           Patient will benefit from skilled therapeutic intervention in order to improve the following deficits and impairments:  Abnormal gait, Pain, Improper body mechanics, Decreased mobility, Decreased range of motion, Decreased strength, Hypomobility, Impaired flexibility, Decreased activity tolerance  Visit Diagnosis: Radiculopathy, lumbar region  Pain in left leg     Problem List Patient Active  Problem List   Diagnosis Date Noted  . Celiac disease 02/20/2019  . RUQ abdominal pain 11/15/2018  . Nausea without vomiting 11/07/2018  . Abdominal pain, epigastric 11/07/2018     TTalbot GrumblingPT, DPT 11/28/19, 2:34 PM [address] [address] [place] [address]Phone: [phone]  Fax:  [phone] Name: Rhonda SUNGAMRN: [id number] of Birth: 1Jan 25, 1957

## 2019-11-29 ENCOUNTER — Encounter (HOSPITAL_COMMUNITY): Payer: BC Managed Care – PPO | Admitting: Physical Therapy

## 2019-12-02 ENCOUNTER — Telehealth (HOSPITAL_COMMUNITY): Payer: Self-pay

## 2019-12-02 NOTE — Telephone Encounter (Signed)
pt cancelled appt for 8/31 because she just doesn't feel like coming in

## 2019-12-03 ENCOUNTER — Encounter (HOSPITAL_COMMUNITY): Payer: BC Managed Care – PPO

## 2019-12-05 ENCOUNTER — Encounter (HOSPITAL_COMMUNITY): Payer: Self-pay

## 2019-12-05 ENCOUNTER — Ambulatory Visit (HOSPITAL_COMMUNITY): Payer: BC Managed Care – PPO | Attending: Physical Medicine and Rehabilitation

## 2019-12-05 ENCOUNTER — Other Ambulatory Visit: Payer: Self-pay

## 2019-12-05 DIAGNOSIS — M5416 Radiculopathy, lumbar region: Secondary | ICD-10-CM

## 2019-12-05 DIAGNOSIS — M79605 Pain in left leg: Secondary | ICD-10-CM | POA: Diagnosis not present

## 2019-12-05 NOTE — Therapy (Signed)
[place], [place], [address] Phone: [phone]   Fax:  [phone]  Physical Therapy Treatment  Patient Details  Name: Rhonda Sullivan MRN: [id number] Date of Birth: 10-02-1955 Referring Provider (PT): Suella Broad MD    Encounter Date: 12/05/2019   PT End of Session - 12/05/19 1008    Visit Number 5    Number of Visits 8    Date for PT Re-Evaluation 12/13/19    Authorization Type BCBS COMM    Progress Note Due on Visit 8    PT Start Time 3953    PT Stop Time 1046    PT Time Calculation (min) 44 min    Activity Tolerance Patient tolerated treatment well    Behavior During Therapy [facility] for tasks assessed/performed           Past Medical History:  Diagnosis Date  . Angio-edema   . GERD (gastroesophageal reflux disease)     Past Surgical History:  Procedure Laterality Date  . ABDOMINAL HYSTERECTOMY    . BIOPSY  12/06/2018   Procedure: BIOPSY;  Surgeon: Rogene Houston, MD;  [facility];  Service: Endoscopy;;  Duodenal Biopsies fopr celiac disease   . ESOPHAGOGASTRODUODENOSCOPY N/A 12/06/2018   Procedure: ESOPHAGOGASTRODUODENOSCOPY (EGD);  Surgeon: Rogene Houston, MD;  [facility];  Service: Endoscopy;  Laterality: N/A;  3:00  . POLYPECTOMY  12/06/2018   Procedure: POLYPECTOMY;  Surgeon: Rogene Houston, MD;  [facility];  Service: Endoscopy;;  gastric polyps biopsy     There were no vitals filed for this visit.   Subjective Assessment - 12/05/19 1006    Subjective Pt reports she had some radicular symptoms down Lt LE earlier, none currently.  Reports it no longer goes down to foot, ending at calf muscle now.  Reports she walked 2 miles this morning and has been compliant with HEP.  Interested in purchasing another bike.    Currently in Pain? Yes    Pain Score 1     Pain Location Back    Pain Type Acute pain    Pain Radiating Towards to hip    Pain Onset More than  a month ago    Pain Frequency Constant    Aggravating Factors  bending, lifting, standing    Pain Relieving Factors laying down    Effect of Pain on Daily Activities limits                             [facility] Adult PT Treatment/Exercise - 12/05/19 0001      Lumbar Exercises: Stretches   Lower Trunk Rotation 5 reps;10 seconds    Lower Trunk Rotation Limitations top leg extended    Press Ups 5 reps;10 seconds      Lumbar Exercises: Supine   Ab Set 20 reps    AB Set Limitations cued with exhale    Bent Knee Raise 10 reps    Bent Knee Raise Limitations cueing for exhale and core sets    Dead Bug 10 reps    Dead Bug Limitations ab set prior raise    Bridge 10 reps;5 seconds    Bridge Limitations 2 sets    Other Supine Lumbar Exercises deep belly breathing 10 breathes      Lumbar Exercises: Sidelying   Clam Both;10 reps    Clam Limitations RTB      Lumbar Exercises: Prone  Straight Leg Raise 10 reps    Other Prone Lumbar Exercises UE lift 5x each                    PT Short Term Goals - 11/21/19 1410      PT SHORT TERM GOAL #1   Title Patient will be independent with initial HEP and self-management strategies to improve functional outcomes    Status On-going             PT Long Term Goals - 11/21/19 1410      PT LONG TERM GOAL #1   Title Patient will improve FOTO score by 10% to indicate improvement in functional outcomes    Time 4    Period Weeks    Status On-going      PT LONG TERM GOAL #2   Title Patient will report at least 60% overall improvement in subjective complaint to indicate improvement in ability to perform ADLs.    Time 4    Period Weeks    Status On-going      PT LONG TERM GOAL #3   Title Patient will improve lumbar AROM by 25% in all restricted planes for improved ability to perform functional mobility tasks and ADLs.    Time 4    Period Weeks    Status On-going                 Plan - 12/05/19 1035     Clinical Impression Statement Continued session focus iwht core strengthening in extension based exercises.  Pt continues to require verbal and tactile cueing for proper abdominal activation paired with breathing to reduce any valsomic maneuver.  Pt able to demonstrate good form with majority of exericses and minimal reports of pain through session.  Added RTB with clams for gluteal strengthening.    Examination-Activity Limitations Lift;Stand;Locomotion Level;Bend;Stairs;Squat    Examination-Participation Restrictions Cleaning;Community Activity;[facility]    Stability/Clinical Decision Making Stable/Uncomplicated    Clinical Decision Making Low    Rehab Potential Good    PT Frequency 2x / week    PT Duration 4 weeks    PT Treatment/Interventions ADLs/Self Care Home Management;Aquatic Therapy;Biofeedback;Cryotherapy;Ultrasound;Parrafin;Fluidtherapy;Therapeutic activities;Patient/family education;Manual techniques;Manual lymph drainage;Splinting;Energy conservation;Spinal Manipulations;Dry needling;Passive range of motion;Scar mobilization;Compression bandaging;Taping;Joint Manipulations;Vasopneumatic Device;Orthotic Fit/Training;Neuromuscular re-education;Balance training;Therapeutic exercise;Gait training;DME Instruction;Contrast Bath;Electrical Stimulation;Iontophoresis 81m/ml Dexamethasone;Moist Heat;Traction;Functional mobility training;Stair training    PT Next Visit Plan Continue core strengthening, extension based due to good results.  Add quadruped exercises next session.    PT Home Exercise Plan 8//1/21: ab set; bed mobility, back extension, knee to chest, hamstring stretch, bridge. 8/24 press up           Patient will benefit from skilled therapeutic intervention in order to improve the following deficits and impairments:  Abnormal gait, Pain, Improper body mechanics, Decreased mobility, Decreased range of motion, Decreased strength, Hypomobility, Impaired flexibility, Decreased  activity tolerance  Visit Diagnosis: Pain in left leg  Radiculopathy, lumbar region     Problem List Patient Active Problem List   Diagnosis Date Noted  . Celiac disease 02/20/2019  . RUQ abdominal pain 11/15/2018  . Nausea without vomiting 11/07/2018  . Abdominal pain, epigastric 11/07/2018   CIhor Austin LPTA/CLT; CBIS [phone] CAldona Lento9/05/2019, 10:50 AM  CDozier[address]SNealmont [place] [address]Phone: [phone]  Fax:  [phone] Name: Rhonda GOGGINSMRN: [id number] of Birth: 118-Jan-1957

## 2019-12-05 NOTE — Patient Instructions (Signed)
Hip Extension: 2-4 Inches    Tighten gluteal muscle. Lift one leg 10 times. Restabilize pelvis. Repeat with other leg. Keep pelvis still. Be sure pelvis does not rotate and back does not arch. Do 2 sets, 10 times per day.  http://ss.exer.us/63   Copyright  VHI. All rights reserved.

## 2019-12-10 ENCOUNTER — Encounter (HOSPITAL_COMMUNITY): Payer: Self-pay | Admitting: Physical Therapy

## 2019-12-10 ENCOUNTER — Other Ambulatory Visit: Payer: Self-pay

## 2019-12-10 ENCOUNTER — Ambulatory Visit (HOSPITAL_COMMUNITY): Payer: BC Managed Care – PPO | Admitting: Physical Therapy

## 2019-12-10 DIAGNOSIS — M5416 Radiculopathy, lumbar region: Secondary | ICD-10-CM

## 2019-12-10 DIAGNOSIS — M79605 Pain in left leg: Secondary | ICD-10-CM | POA: Diagnosis not present

## 2019-12-10 NOTE — Therapy (Signed)
[place], [place], [address] Phone: [phone]   Fax:  [phone]  Physical Therapy Treatment  Patient Details  Name: Rhonda Sullivan MRN: [id number] Date of Birth: [birth year] Referring Provider (PT): Suella Broad MD    Encounter Date: 12/10/2019   PT End of Session - 12/10/19 0908    Visit Number 6    Number of Visits 8    Date for PT Re-Evaluation 12/13/19    Authorization Type BCBS COMM    Progress Note Due on Visit 8    PT Start Time 0903    PT Stop Time 0950    PT Time Calculation (min) 47 min    Activity Tolerance Patient tolerated treatment well    Behavior During Therapy [facility] for tasks assessed/performed           Past Medical History:  Diagnosis Date  . Angio-edema   . GERD (gastroesophageal reflux disease)     Past Surgical History:  Procedure Laterality Date  . ABDOMINAL HYSTERECTOMY    . BIOPSY  12/06/2018   Procedure: BIOPSY;  Surgeon: Rogene Houston, MD;  [facility];  Service: Endoscopy;;  Duodenal Biopsies fopr celiac disease   . ESOPHAGOGASTRODUODENOSCOPY N/A 12/06/2018   Procedure: ESOPHAGOGASTRODUODENOSCOPY (EGD);  Surgeon: Rogene Houston, MD;  [facility];  Service: Endoscopy;  Laterality: N/A;  3:00  . POLYPECTOMY  12/06/2018   Procedure: POLYPECTOMY;  Surgeon: Rogene Houston, MD;  [facility];  Service: Endoscopy;;  gastric polyps biopsy     There were no vitals filed for this visit.   Subjective Assessment - 12/10/19 0907    Subjective Patient says she feels that prone extensions are the most helpful for relieving pain and radiculopathy in back and leg. Says she is doing better overall, but still has some rough times, mostly at nights.    Currently in Pain? Yes    Pain Score 1     Pain Location Back    Pain Orientation Posterior;Lower;Mid    Pain Descriptors / Indicators Aching    Pain Type Acute pain    Pain Radiating Towards LT calf  (at night only)    Pain Onset More than a month ago    Pain Frequency Intermittent                             [facility] Adult PT Treatment/Exercise - 12/10/19 0001      Lumbar Exercises: Stretches   Prone on Elbows Stretch 2 reps;60 seconds    Press Ups 20 reps   2 x 10, 2nd set press up with sag (exhale)   Piriformis Stretch Right;Left;5 reps;10 seconds      Lumbar Exercises: Supine   Ab Set 10 reps;5 seconds    Bent Knee Raise 20 reps    Dead Bug 10 reps    Bridge 5 seconds;20 reps      Lumbar Exercises: Sidelying   Clam Both;10 reps    Clam Limitations RTB 3 sec hold       Lumbar Exercises: Prone   Straight Leg Raise 10 reps                    PT Short Term Goals - 11/21/19 1410      PT SHORT TERM GOAL #1   Title Patient will be independent with initial HEP and self-management strategies to improve functional  outcomes    Status On-going             PT Long Term Goals - 11/21/19 1410      PT LONG TERM GOAL #1   Title Patient will improve FOTO score by 10% to indicate improvement in functional outcomes    Time 4    Period Weeks    Status On-going      PT LONG TERM GOAL #2   Title Patient will report at least 60% overall improvement in subjective complaint to indicate improvement in ability to perform ADLs.    Time 4    Period Weeks    Status On-going      PT LONG TERM GOAL #3   Title Patient will improve lumbar AROM by 25% in all restricted planes for improved ability to perform functional mobility tasks and ADLs.    Time 4    Period Weeks    Status On-going                 Plan - 12/10/19 1003    Clinical Impression Statement Patient with noted tension in LT hip with added piriformis stretching. Patient reports increased discomfort from 1-2/10 post stretch. Patient performs all ther ex in a slow controlled manner. Patient reports centralized symptoms to sacral region after 10 x press up. Patient progressed to press up  with sag for progression of forces with extension-based exercise. Patient reported no further benefit to progression, so patient recommended to remain at this level with HEP. Patient educated on spine mechanics and purpose of extension-based exercises, and educated on process of progression of forced for improving radicular back pain. Patient educated on HEP and frequency.    Examination-Activity Limitations Lift;Stand;Locomotion Level;Bend;Stairs;Squat    Examination-Participation Restrictions Cleaning;Community Activity;[facility]    Stability/Clinical Decision Making Stable/Uncomplicated    Rehab Potential Good    PT Frequency 2x / week    PT Duration 4 weeks    PT Treatment/Interventions ADLs/Self Care Home Management;Aquatic Therapy;Biofeedback;Cryotherapy;Ultrasound;Parrafin;Fluidtherapy;Therapeutic activities;Patient/family education;Manual techniques;Manual lymph drainage;Splinting;Energy conservation;Spinal Manipulations;Dry needling;Passive range of motion;Scar mobilization;Compression bandaging;Taping;Joint Manipulations;Vasopneumatic Device;Orthotic Fit/Training;Neuromuscular re-education;Balance training;Therapeutic exercise;Gait training;DME Instruction;Contrast Bath;Electrical Stimulation;Iontophoresis 62m/ml Dexamethasone;Moist Heat;Traction;Functional mobility training;Stair training    PT Next Visit Plan Assess response to updated HEP frequency. Reassess next visit. Adjust POC as indicated.    PT Home Exercise Plan 8//1/21: ab set; bed mobility, back extension, knee to chest, hamstring stretch, bridge. 8/24 press up           Patient will benefit from skilled therapeutic intervention in order to improve the following deficits and impairments:  Abnormal gait, Pain, Improper body mechanics, Decreased mobility, Decreased range of motion, Decreased strength, Hypomobility, Impaired flexibility, Decreased activity tolerance  Visit Diagnosis: Pain in left leg  Radiculopathy,  lumbar region     Problem List Patient Active Problem List   Diagnosis Date Noted  . Celiac disease 02/20/2019  . RUQ abdominal pain 11/15/2018  . Nausea without vomiting 11/07/2018  . Abdominal pain, epigastric 11/07/2018    10:05 AM, 12/10/19 CJosue HectorPT DPT  Physical [facility]rap[facility] [phone]SA[address] [address]Phone: [phone]  Fax:  [phone] Name: DIVELIZ GARAYMRN: [id number] of Birth: [birth year]

## 2019-12-12 ENCOUNTER — Ambulatory Visit (HOSPITAL_COMMUNITY): Payer: BC Managed Care – PPO | Admitting: Physical Therapy

## 2019-12-12 ENCOUNTER — Other Ambulatory Visit: Payer: Self-pay

## 2019-12-12 ENCOUNTER — Encounter (HOSPITAL_COMMUNITY): Payer: Self-pay | Admitting: Physical Therapy

## 2019-12-12 DIAGNOSIS — M79605 Pain in left leg: Secondary | ICD-10-CM

## 2019-12-12 DIAGNOSIS — M5416 Radiculopathy, lumbar region: Secondary | ICD-10-CM

## 2019-12-12 NOTE — Therapy (Signed)
[place] [address] [place], [place], [address] Phone: [phone]   Fax:  [phone]  Physical Therapy Treatment  Patient Details  Name: Rhonda Sullivan MRN: [id number] Date of Birth: 1955-09-03 Referring Provider (PT): Suella Broad MD   Progress Note Reporting Period 11/13/19 to 12/12/19  See note below for Objective Data and Assessment of Progress/Goals.       Encounter Date: 12/12/2019   PT End of Session - 12/12/19 0834    Visit Number 7    Number of Visits 15    Date for PT Re-Evaluation 01/10/20    Authorization Type BCBS COMM    Progress Note Due on Visit 15    PT Start Time 0818    PT Stop Time 0858    PT Time Calculation (min) 40 min    Activity Tolerance Patient tolerated treatment well    Behavior During Therapy [facility] for tasks assessed/performed           Past Medical History:  Diagnosis Date  . Angio-edema   . GERD (gastroesophageal reflux disease)     Past Surgical History:  Procedure Laterality Date  . ABDOMINAL HYSTERECTOMY    . BIOPSY  12/06/2018   Procedure: BIOPSY;  Surgeon: Rogene Houston, MD;  [facility];  Service: Endoscopy;;  Duodenal Biopsies fopr celiac disease   . ESOPHAGOGASTRODUODENOSCOPY N/A 12/06/2018   Procedure: ESOPHAGOGASTRODUODENOSCOPY (EGD);  Surgeon: Rogene Houston, MD;  [facility];  Service: Endoscopy;  Laterality: N/A;  3:00  . POLYPECTOMY  12/06/2018   Procedure: POLYPECTOMY;  Surgeon: Rogene Houston, MD;  [facility];  Service: Endoscopy;;  gastric polyps biopsy     There were no vitals filed for this visit.   Subjective Assessment - 12/12/19 0832    Subjective Patient says she has been trying to do her press ups more frequently. Says he was really sore in her legs the last 2 days. Says She did better at night on Tuesday, says she was a little sore last night but still slept well. Patient says she feels extension exercise helps at the  time, but feels her pain is more frequent. Patient reports about 30% improvement since starting therapy. Says pain is not gone, but feels that she knows now what to do to relieve pain.    Limitations Lifting;House hold activities;Standing;Walking    Diagnostic tests MRI    Patient Stated Goals No pain    Currently in Pain? Yes    Pain Score 3     Pain Location Leg    Pain Orientation Left;Posterior    Pain Descriptors / Indicators Burning;Aching    Pain Type Acute pain    Pain Onset More than a month ago    Pain Frequency Intermittent    Effect of Pain on Daily Activities Limits              [facility] PT Assessment - 12/12/19 0001      Assessment   Medical Diagnosis Lumbar spinal stenosis     Referring Provider (PT) Suella Broad MD     Prior Therapy No       Precautions   Precautions None      Restrictions   Weight Bearing Restrictions No      Balance Screen   Has the patient fallen in the past 6 months No      [place] residence    Living Arrangements Spouse/significant  other      Prior Function   Level of Independence Independent      Cognition   Overall Cognitive Status Within Functional Limits for tasks assessed      Observation/Other Assessments   Focus on Therapeutic Outcomes (FOTO)  41% limited    was 48% limited      AROM   Overall AROM Comments Significant improvement in AROM but with increased leg pain in all planes     Lumbar Flexion 25% limited   was 50% limited   Lumbar Extension 25% limited    was 50% limited   Lumbar - Right Side Bend WFL     Lumbar - Left Side Bend WFL     Lumbar - Right Rotation WFL   was 25% limited    Lumbar - Left Rotation WFL    was 25% limited                         [facility] Adult PT Treatment/Exercise - 12/12/19 0001      Lumbar Exercises: Stretches   Prone on Elbows Stretch 1 rep;60 seconds    Press Ups 20 reps                  PT Education - 12/12/19 0848     Education Details on reassessment findings, and POC    Person(s) Educated Patient    Methods Explanation    Comprehension Verbalized understanding            PT Short Term Goals - 12/12/19 0850      PT SHORT TERM GOAL #1   Title Patient will be independent with initial HEP and self-management strategies to improve functional outcomes    Status Achieved             PT Long Term Goals - 12/12/19 0850      PT LONG TERM GOAL #1   Title Patient will improve FOTO score by 10% to indicate improvement in functional outcomes    Baseline 7% improved    Time 4    Period Weeks    Status On-going      PT LONG TERM GOAL #2   Title Patient will report at least 60% overall improvement in subjective complaint to indicate improvement in ability to perform ADLs.    Baseline Reports 30% improvement    Time 4    Period Weeks    Status On-going      PT LONG TERM GOAL #3   Title Patient will improve lumbar AROM by 25% in all restricted planes for improved ability to perform functional mobility tasks and ADLs.    Baseline See AROM    Time 4    Period Weeks    Status Achieved                 Plan - 12/12/19 0853    Clinical Impression Statement Patient demos good progress toward long term goals. Patient current with all short term and 1/3 long term goals met. Patient shows improved AROM, but continues to be limited by radicular symptoms in LLE. Patient has shows improvement with centralization of symptoms with HEP, but pain is not fully abolished. Patient will continue to benefit from skilled therapy services to address remaining deficits, and to progress strengthening and stabilization programming to reduce pain and improve LOF with ADLs.    Examination-Activity Limitations Lift;Stand;Locomotion Level;Bend;Stairs;Squat    Examination-Participation Restrictions Cleaning;Community Activity;Valla Leaver [employer]  Stability/Clinical Decision Making Stable/Uncomplicated    Rehab  Potential Good    PT Frequency 2x / week    PT Duration 4 weeks    PT Treatment/Interventions ADLs/Self Care Home Management;Aquatic Therapy;Biofeedback;Cryotherapy;Ultrasound;Parrafin;Fluidtherapy;Therapeutic activities;Patient/family education;Manual techniques;Manual lymph drainage;Splinting;Energy conservation;Spinal Manipulations;Dry needling;Passive range of motion;Scar mobilization;Compression bandaging;Taping;Joint Manipulations;Vasopneumatic Device;Orthotic Fit/Training;Neuromuscular re-education;Balance training;Therapeutic exercise;Gait training;DME Instruction;Contrast Bath;Electrical Stimulation;Iontophoresis 6m/ml Dexamethasone;Moist Heat;Traction;Functional mobility training;Stair training    PT Next Visit Plan Continue to progress hip and cores strength as tolerated. Continue to moniter response to extension based exercise. Progress to satnding and functional strength next visit (standing hip abduction, extensions, mini squats, step ups, palloff press)    PT Home Exercise Plan 8//1/21: ab set; bed mobility, back extension, knee to chest, hamstring stretch, bridge. 8/24 press up    Consulted and Agree with Plan of Care Patient           Patient will benefit from skilled therapeutic intervention in order to improve the following deficits and impairments:  Abnormal gait, Pain, Improper body mechanics, Decreased mobility, Decreased range of motion, Decreased strength, Hypomobility, Impaired flexibility, Decreased activity tolerance  Visit Diagnosis: Pain in left leg  Radiculopathy, lumbar region     Problem List Patient Active Problem List   Diagnosis Date Noted  . Celiac disease 02/20/2019  . RUQ abdominal pain 11/15/2018  . Nausea without vomiting 11/07/2018  . Abdominal pain, epigastric 11/07/2018    8:57 AM, 12/12/19 CJosue HectorPT DPT  [facility] [phone] [address] [address] [place] [address]Phone: [phone]  Fax:  [phone] Name: DRASHELL SHAMBAUGHMRN: [id number] of Birth: 103-May-1957

## 2019-12-16 ENCOUNTER — Telehealth (HOSPITAL_COMMUNITY): Payer: Self-pay | Admitting: Physical Therapy

## 2019-12-16 ENCOUNTER — Telehealth (INDEPENDENT_AMBULATORY_CARE_PROVIDER_SITE_OTHER): Payer: Self-pay | Admitting: Gastroenterology

## 2019-12-16 NOTE — Telephone Encounter (Signed)
Patient left message stating she has appointment on 9/15 but she also has an appointment with a Rheumatologist - wants to know if she should wait to be seen here after she sees the reheumatologist  - please advise - ph# [phone]

## 2019-12-16 NOTE — Telephone Encounter (Signed)
Will be fine to keep her appointment this week as scheduled.  She can see [place] before or after rheumatology.

## 2019-12-16 NOTE — Telephone Encounter (Signed)
Pt cx due to schedule conflict

## 2019-12-17 ENCOUNTER — Encounter (HOSPITAL_COMMUNITY): Payer: BC Managed Care – PPO | Admitting: Physical Therapy

## 2019-12-18 ENCOUNTER — Other Ambulatory Visit: Payer: Self-pay

## 2019-12-18 ENCOUNTER — Ambulatory Visit (INDEPENDENT_AMBULATORY_CARE_PROVIDER_SITE_OTHER): Payer: BC Managed Care – PPO | Admitting: Gastroenterology

## 2019-12-18 ENCOUNTER — Encounter (INDEPENDENT_AMBULATORY_CARE_PROVIDER_SITE_OTHER): Payer: Self-pay | Admitting: Gastroenterology

## 2019-12-18 VITALS — BP 117/74 | HR 67 | Temp 98.1°F | Ht 62.0 in | Wt 143.5 lb

## 2019-12-18 DIAGNOSIS — R5383 Other fatigue: Secondary | ICD-10-CM

## 2019-12-18 DIAGNOSIS — E559 Vitamin D deficiency, unspecified: Secondary | ICD-10-CM | POA: Diagnosis not present

## 2019-12-18 DIAGNOSIS — K900 Celiac disease: Secondary | ICD-10-CM | POA: Diagnosis not present

## 2019-12-18 MED ORDER — PANTOPRAZOLE SODIUM 40 MG PO TBEC: 40.0000 mg | DELAYED_RELEASE_TABLET | Freq: Every day | ORAL | 3 refills | Status: DC

## 2019-12-18 NOTE — Patient Instructions (Addendum)
We are checking labs today for Vit D and Vit B12 labs - continue protonix. Please call w/ any change in symptoms.

## 2019-12-18 NOTE — Progress Notes (Addendum)
Patient profile: Rhonda Sullivan is a 64 y.o. female seen for f/up of Celiac disease. Last seen 05/2019, she has a past medical history of celiac disease as well as alpha gal.  She previously had nausea, vomiting, abdominal pain.  We switched omeprazole to pantoprazole as omeprazole coting contained animal products.  Also diagnosed with vitamin D deficiency and started vitamin D supplement.  More recently she has had labs with her allergist showing an elevated ANA.  CRP and sed rate were normal. Planning to see rheum.   History of Present Illness: Rhonda Sullivan is seen today and reports overall feeling well.  She has to very rare nausea at this point, only having once to use Zofran approximately once a month.  She denies any vomiting or reflux symptoms as long she takes pantoprazole.  No dysphagia.  Appetite good.  She is intentionally trying to lose weight and hopes to lose another 10 pounds.  Typically is having a bowel movement daily.  She does take Citrucel 1-3 times a day.  She feels if she does not take Citrucel she would have 3 to 4 days between stools.  Denies any lower abdominal pain, diarrhea, rectal bleeding.  She is completely avoiding gluten and may milk products.  This is been somewhat difficult as she eats a lot of chicken, salads, etc.  Very rare intake of small amount of bread, sometimes will trigger mild symptoms.  Wt Readings from Last 3 Encounters:  12/18/19 143 lb 8 oz (65.1 kg)  11/08/19 145 lb 3.2 oz (65.9 kg)  05/27/19 157 lb 4.8 oz (71.4 kg)    Last Colonoscopy: 2016-normal  Last Endoscopy: Endoscopy September 2020-grade a esophagitis, sessile polyps gastric fundus, pathology with duodenal biopsy showed an increased number intraepithelial lymphocytosis and mild villous architectural changes suggestive of possible early celiac disease.  Stomach polyps were hyperplastic  Past Medical History:  Past Medical History:  Diagnosis Date  . Angio-edema   . GERD  (gastroesophageal reflux disease)     Problem List: Patient Active Problem List   Diagnosis Date Noted  . Celiac disease 02/20/2019  . RUQ abdominal pain 11/15/2018  . Nausea without vomiting 11/07/2018  . Abdominal pain, epigastric 11/07/2018    Past Surgical History: Past Surgical History:  Procedure Laterality Date  . ABDOMINAL HYSTERECTOMY    . BIOPSY  12/06/2018   Procedure: BIOPSY;  Surgeon: Rogene Houston, MD;  [facility];  Service: Endoscopy;;  Duodenal Biopsies fopr celiac disease   . ESOPHAGOGASTRODUODENOSCOPY N/A 12/06/2018   Procedure: ESOPHAGOGASTRODUODENOSCOPY (EGD);  Surgeon: Rogene Houston, MD;  [facility];  Service: Endoscopy;  Laterality: N/A;  3:00  . POLYPECTOMY  12/06/2018   Procedure: POLYPECTOMY;  Surgeon: Rogene Houston, MD;  [facility];  Service: Endoscopy;;  gastric polyps biopsy     Allergies: Allergies  Allergen Reactions  . Alpha-Gal Other (See Comments)    When patient eats beef - stomach pain - would have to go to bed. Confirmed by allergist after Tick bite.  . Other Other (See Comments)    Metal- body rejects it       Home Medications:  Current Outpatient Medications:  .  ACCU-CHEK GUIDE test strip, USE TO TEST TWICE A DAY, Disp: , Rfl:  .  Alpha-Lipoic Acid 600 MG CAPS, Take by mouth daily., Disp: , Rfl:  .  Calcium Citrate (CITRACAL PO), Take by mouth in the morning, at noon, and at bedtime., Disp: , Rfl:  .  Chromium Picolinate 1000 MCG TABS, Take by mouth. Patient states that she takes 1 by mouth every other day., Disp: , Rfl:  .  Magnesium 400 MG CAPS, Take 400 mg by mouth daily. Patient states that she takes for cramps., Disp: , Rfl:  .  Multiple Vitamin (MULTI VITAMIN DAILY PO), Take by mouth 2 (two) times daily. Advanced MVI, Disp: , Rfl:  .  NON FORMULARY, Terry Naturally Curamed 700 mg daily., Disp: , Rfl:  .  ondansetron (ZOFRAN) 4 MG tablet, ondansetron HCl 4 mg tablet  TAKE 1 TABLET BY MOUTH  EVERY 8 HOURS AS NEEDED FOR NAUSEA AND VOMITING, Disp: , Rfl:  .  OVER THE COUNTER MEDICATION, Bone Strengthening - Patient states that she takes 5 per day Included Calcium 1300 mg with  Vitamin D and K., Disp: , Rfl:  .  pantoprazole (PROTONIX) 40 MG tablet, Take 1 tablet (40 mg total) by mouth daily., Disp: 90 tablet, Rfl: 3 .  Probiotic Product (DAILY PROBIOTIC PO), Take by mouth. Patient states that this is weight Management Probiotic - Patient takes one by mouth daily., Disp: , Rfl:  .  Vitamin Mixture (ESTER-C PO), Take 2,000 mg by mouth daily., Disp: , Rfl:    Family History: family history includes Allergic rhinitis in her mother.    Social History:   reports that she has never smoked. She has never used smokeless tobacco. She reports that she does not drink alcohol and does not use drugs.   Review of Systems: Constitutional: Denies weight loss/weight gain  Eyes: No changes in vision. ENT: No oral lesions, sore throat.  GI: see HPI.  Heme/Lymph: No easy bruising.  CV: No chest pain.  GU: No hematuria.  Integumentary: No rashes.  Neuro: No headaches.  Psych: No depression/anxiety.  Endocrine: No heat/cold intolerance.  Allergic/Immunologic: No urticaria.  Resp: No cough, SOB.  Musculoskeletal: No joint swelling.    Physical Examination: BP 117/74 (BP Location: Right Arm, Patient Position: Sitting, Cuff Size: Normal)   Pulse 67   Temp 98.1 F (36.7 C) (Oral)   Ht 5' 2"  (1.575 m)   Wt 143 lb 8 oz (65.1 kg)   BMI 26.25 kg/m  Gen: NAD, alert and oriented x 4 HEENT: PEERLA, EOMI, Neck: supple, no JVD Chest: CTA bilaterally, no wheezes, crackles, or other adventitious sounds CV: RRR, no m/g/c/r Abd: soft, NT, ND, +BS in all four quadrants; no HSM, guarding, ridigity, or rebound tenderness Ext: no edema, well perfused with 2+ pulses, Skin: no rash or lesions noted on observed skin Lymph: no noted LAD  Data Reviewed:   Labs in epic reviewed- February 2021-CMP normal,  vitamin D 21, CBC normal.  August 2021-CRP normal  Assessment/Plan: Ms. Hogans is a 64 y.o. female seen for f/up    1.  Nausea vomiting diarrhea abdominal pain-symptoms have resolved with avoidance of gluten and animal products.  She is currently doing great symptom wise.  She has Zofran to use once a month as needed.  Prefers to continue current regimen.  2.  History of vitamin D deficiency-low in February 2021, did 4-week course of weekly 50,000 units.  She takes a "bone strong supplement" with calcium, vitamin D and vitamin K.  We will repeat today to ensure stable  3.  Fatigue-we will check a B12 lab.  Otherwise follow-up with PCP.  UTD on colonoscopy-Due 2026   Jena was seen today for follow-up.  Diagnoses and all orders for this visit:  Vitamin D deficiency -  Vitamin D (25 hydroxy)  Fatigue, unspecified type -     B12  Celiac disease  Other orders -     pantoprazole (PROTONIX) 40 MG tablet; Take 1 tablet (40 mg total) by mouth daily.     Recommendations: f/up 1 year if doing well. Sooner w/ any symptoms.    I personally performed the service, non-incident to. (WP)  Laurine Blazer, [facility]

## 2019-12-19 ENCOUNTER — Ambulatory Visit (HOSPITAL_COMMUNITY): Payer: BC Managed Care – PPO | Admitting: Physical Therapy

## 2019-12-19 ENCOUNTER — Encounter (HOSPITAL_COMMUNITY): Payer: Self-pay | Admitting: Physical Therapy

## 2019-12-19 DIAGNOSIS — M79605 Pain in left leg: Secondary | ICD-10-CM

## 2019-12-19 DIAGNOSIS — M5416 Radiculopathy, lumbar region: Secondary | ICD-10-CM

## 2019-12-19 NOTE — Therapy (Signed)
[place], [place], [address] Phone: [phone]   Fax:  [phone]  Physical Therapy Treatment  Patient Details  Name: Rhonda Sullivan MRN: [id number] Date of Birth: March 23, 1956 Referring Provider (PT): Suella Broad MD    Encounter Date: 12/19/2019   PT End of Session - 12/19/19 0818    Visit Number 8    Number of Visits 15    Date for PT Re-Evaluation 01/10/20    Authorization Type BCBS COMM    Progress Note Due on Visit 15    PT Start Time 0818    PT Stop Time 0900    PT Time Calculation (min) 42 min    Activity Tolerance Patient tolerated treatment well    Behavior During Therapy [facility] for tasks assessed/performed           Past Medical History:  Diagnosis Date   Angio-edema    GERD (gastroesophageal reflux disease)     Past Surgical History:  Procedure Laterality Date   ABDOMINAL HYSTERECTOMY     BIOPSY  12/06/2018   Procedure: BIOPSY;  Surgeon: Rogene Houston, MD;  [facility];  Service: Endoscopy;;  Duodenal Biopsies fopr celiac disease    ESOPHAGOGASTRODUODENOSCOPY N/A 12/06/2018   Procedure: ESOPHAGOGASTRODUODENOSCOPY (EGD);  Surgeon: Rogene Houston, MD;  [facility];  Service: Endoscopy;  Laterality: N/A;  3:00   POLYPECTOMY  12/06/2018   Procedure: POLYPECTOMY;  Surgeon: Rogene Houston, MD;  [facility];  Service: Endoscopy;;  gastric polyps biopsy     There were no vitals filed for this visit.   Subjective Assessment - 12/19/19 0824    Subjective Patient says she has been doing the stretches more and can tell that this has been a big help.    Limitations Lifting;House hold activities;Standing;Walking    Diagnostic tests MRI    Patient Stated Goals No pain    Currently in Pain? Yes    Pain Score 1     Pain Location Leg    Pain Orientation Left    Pain Descriptors / Indicators Aching    Pain Type Acute pain    Pain Onset More than a month  ago    Pain Frequency Intermittent                             [facility] Adult PT Treatment/Exercise - 12/19/19 0001      Exercises   Exercises Knee/Hip      Lumbar Exercises: Stretches   Press Ups 2 reps;10 reps      Lumbar Exercises: Standing   Heel Raises 20 reps    Functional Squats 20 reps    Functional Squats Limitations to chair for depth cue     Other Standing Lumbar Exercises REIS x10     Other Standing Lumbar Exercises palloff press RTB x20 each       Lumbar Exercises: Supine   Ab Set 10 reps;5 seconds    Bent Knee Raise 20 reps    Bridge 10 reps;5 seconds      Knee/Hip Exercises: Standing   Hip Abduction Both;2 sets;10 reps    Hip Extension Both;2 sets;10 reps    Forward Step Up Both;2 sets;10 reps;Hand Hold: 0;Step Height: 6"                    PT Short Term Goals - 12/12/19 2637  PT SHORT TERM GOAL #1   Title Patient will be independent with initial HEP and self-management strategies to improve functional outcomes    Status Achieved             PT Long Term Goals - 12/12/19 0850      PT LONG TERM GOAL #1   Title Patient will improve FOTO score by 10% to indicate improvement in functional outcomes    Baseline 7% improved    Time 4    Period Weeks    Status On-going      PT LONG TERM GOAL #2   Title Patient will report at least 60% overall improvement in subjective complaint to indicate improvement in ability to perform ADLs.    Baseline Reports 30% improvement    Time 4    Period Weeks    Status On-going      PT LONG TERM GOAL #3   Title Patient will improve lumbar AROM by 25% in all restricted planes for improved ability to perform functional mobility tasks and ADLs.    Baseline See AROM    Time 4    Period Weeks    Status Achieved                 Plan - 12/19/19 0858    Clinical Impression Statement Patient tolerated session well today. Progressed LE and core strengthening in standing position.  Patient cued on proper form and function of all added exercise. Patient noted slight increase in LLE symptoms with standing activity, but was reduced after instructed to perform REIS. Patient cued on proper form with REIS, and hand placement for bracing on hips. Patient with reduce pain post treatment. Patient educated on and issued updated HEP handout.    Examination-Activity Limitations Lift;Stand;Locomotion Level;Bend;Stairs;Squat    Examination-Participation Restrictions Cleaning;Community Activity;[facility]aver [facility]    Stability/Clinical Decision Making Stable/Uncomplicated    Rehab Potential Good    PT Frequency 2x / week    PT Duration 4 weeks    PT Treatment/Interventions ADLs/Self Care Home Management;Aquatic Therapy;Biofeedback;Cryotherapy;Ultrasound;Parrafin;Fluidtherapy;Therapeutic activities;Patient/family education;Manual techniques;Manual lymph drainage;Splinting;Energy conservation;Spinal Manipulations;Dry needling;Passive range of motion;Scar mobilization;Compression bandaging;Taping;Joint Manipulations;Vasopneumatic Device;Orthotic Fit/Training;Neuromuscular re-education;Balance training;Therapeutic exercise;Gait training;DME Instruction;Contrast Bath;Electrical Stimulation;Iontophoresis 70m/ml Dexamethasone;Moist Heat;Traction;Functional mobility training;Stair training    PT Next Visit Plan Continue to progress hip and cores strength as tolerated. Continue to moniter response to extension based exercise. Continue with standing and functional strength. Add med ball to squats and legs elevated dead bugs next visit.    PT Home Exercise Plan 8//1/21: ab set; bed mobility, back extension, knee to chest, hamstring stretch, bridge. 8/24 press up 12/19/19: heel raise, standing hip abd/ ext, chair squat    Consulted and Agree with Plan of Care Patient           Patient will benefit from skilled therapeutic intervention in order to improve the following deficits and impairments:  Abnormal  gait, Pain, Improper body mechanics, Decreased mobility, Decreased range of motion, Decreased strength, Hypomobility, Impaired flexibility, Decreased activity tolerance  Visit Diagnosis: Pain in left leg  Radiculopathy, lumbar region     Problem List Patient Active Problem List   Diagnosis Date Noted   Celiac disease 02/20/2019   RUQ abdominal pain 11/15/2018   Nausea without vomiting 11/07/2018   Abdominal pain, epigastric 11/07/2018    9:02 AM, 12/19/19 CJosue HectorPT DPT  [facility] [phone]  [facility]  [place], [place], [address] Phone: [phone]   Fax:  [phone]  Name: Rhonda Sullivan MRN: [id number] Date of Birth: 1955/08/24

## 2019-12-19 NOTE — Patient Instructions (Signed)
Access Code: [email or web address] URL: [email or web address] Date: 12/19/2019 Prepared by: Josue Hector  Exercises Prone Press Up - 3-4 x daily - 7 x weekly - 2 sets - 10 reps Standing Lumbar Extension - 3-4 x daily - 7 x weekly - 2 sets - 10 reps Supine Transversus Abdominis Bracing - Hands on Stomach - 2 x daily - 4 x weekly - 2 sets - 10 reps - 5 sec hold Supine March - 2 x daily - 4 x weekly - 2 sets - 10 reps Supine Dead Bug with Leg Extension - 2 x daily - 4 x weekly - 2 sets - 10 reps Standing Heel Raise - 2 x daily - 4 x weekly - 2 sets - 10 reps Standing Hip Abduction with Counter Support - 2 x daily - 4 x weekly - 2 sets - 10 reps Standing Hip Extension with Counter Support - 2 x daily - 4 x weekly - 2 sets - 10 reps Squat with Chair Touch - 2 x daily - 4 x weekly - 2 sets - 10 reps

## 2019-12-23 ENCOUNTER — Telehealth (HOSPITAL_COMMUNITY): Payer: Self-pay | Admitting: Physical Therapy

## 2019-12-23 NOTE — Telephone Encounter (Signed)
Pt called cx no reason given

## 2019-12-24 ENCOUNTER — Encounter (HOSPITAL_COMMUNITY): Payer: BC Managed Care – PPO | Admitting: Physical Therapy

## 2019-12-25 ENCOUNTER — Telehealth (INDEPENDENT_AMBULATORY_CARE_PROVIDER_SITE_OTHER): Payer: Self-pay | Admitting: Gastroenterology

## 2019-12-25 ENCOUNTER — Other Ambulatory Visit (INDEPENDENT_AMBULATORY_CARE_PROVIDER_SITE_OTHER): Payer: Self-pay

## 2019-12-25 ENCOUNTER — Other Ambulatory Visit (INDEPENDENT_AMBULATORY_CARE_PROVIDER_SITE_OTHER): Payer: Self-pay | Admitting: Gastroenterology

## 2019-12-25 LAB — VITAMIN B12: Vitamin B-12: 483 pg/mL (ref 200–1100)

## 2019-12-25 LAB — VITAMIN D 25 HYDROXY (VIT D DEFICIENCY, FRACTURES): Vit D, 25-Hydroxy: 36 ng/mL (ref 30–100)

## 2019-12-25 MED ORDER — ONDANSETRON HCL 4 MG PO TABS: ORAL_TABLET | ORAL | 1 refills | Status: DC

## 2019-12-25 NOTE — Telephone Encounter (Signed)
Patient called stated she was supposed to have a prescription for odanzetron sent in to her pharmacy but they haven't received it yet - please advise - ph# [phone]

## 2019-12-25 NOTE — Telephone Encounter (Signed)
I have left Rhonda Sullivan a voicemail stating that we have sent a refill of her medication to the pharmacy

## 2019-12-26 ENCOUNTER — Other Ambulatory Visit: Payer: Self-pay

## 2019-12-26 ENCOUNTER — Ambulatory Visit (HOSPITAL_COMMUNITY): Payer: BC Managed Care – PPO

## 2019-12-26 ENCOUNTER — Encounter (HOSPITAL_COMMUNITY): Payer: Self-pay

## 2019-12-26 DIAGNOSIS — M79605 Pain in left leg: Secondary | ICD-10-CM

## 2019-12-26 DIAGNOSIS — M5416 Radiculopathy, lumbar region: Secondary | ICD-10-CM

## 2019-12-26 NOTE — Patient Instructions (Signed)
Rhonda Sullivan "Seven Ways to Have a Pain Free Life"

## 2019-12-26 NOTE — Therapy (Signed)
[place], [place], [address] Phone: [phone]   Fax:  [phone]  Physical Therapy Treatment  Patient Details  Name: Rhonda Sullivan MRN: [id number] Date of Birth: Oct 23, 1955 Referring Provider (PT): Suella Broad MD    Encounter Date: 12/26/2019   PT End of Session - 12/26/19 0847    Visit Number 9    Number of Visits 15    Date for PT Re-Evaluation 01/10/20    Authorization Type BCBS COMM    Progress Note Due on Visit 15    PT Start Time 0852    PT Stop Time 0932    PT Time Calculation (min) 40 min    Activity Tolerance Patient tolerated treatment well    Behavior During Therapy [facility] for tasks assessed/performed           Past Medical History:  Diagnosis Date  . Angio-edema   . GERD (gastroesophageal reflux disease)     Past Surgical History:  Procedure Laterality Date  . ABDOMINAL HYSTERECTOMY    . BIOPSY  12/06/2018   Procedure: BIOPSY;  Surgeon: Rogene Houston, MD;  [facility];  Service: Endoscopy;;  Duodenal Biopsies fopr celiac disease   . ESOPHAGOGASTRODUODENOSCOPY N/A 12/06/2018   Procedure: ESOPHAGOGASTRODUODENOSCOPY (EGD);  Surgeon: Rogene Houston, MD;  [facility];  Service: Endoscopy;  Laterality: N/A;  3:00  . POLYPECTOMY  12/06/2018   Procedure: POLYPECTOMY;  Surgeon: Rogene Houston, MD;  [facility];  Service: Endoscopy;;  gastric polyps biopsy     There were no vitals filed for this visit.   Subjective Assessment - 12/26/19 0847    Subjective Had a pop feeling in left front thigh after last session with increased pain in front of leg for a few days. Didn't do exercises for a few days; pain has gotten better; she began doing exercises again and pain level today at max a 1/10.    Limitations Lifting;House hold activities;Standing;Walking    Diagnostic tests MRI    Patient Stated Goals No pain    Pain Onset More than a month ago              [facility] Adult PT Treatment/Exercise - 12/26/19 0001      Lumbar Exercises: Standing   Heel Raises 20 reps   on incline   Functional Squats 20 reps    Functional Squats Limitations ; orange med ball    Lifting From 12";5 reps    Lifting Weights (lbs) orande medicine ball    Lifting Limitations 2 sets    Other Standing Lumbar Exercises REIS 3x10  throughout exercises for symptom control    Other Standing Lumbar Exercises modified tandem palloff press RTB x10 each bilateral       Lumbar Exercises: Supine   Bent Knee Raise 20 reps    Dead Bug 20 reps    Dead Bug Limitations from legs elevated position    Bridge 10 reps;5 seconds      Knee/Hip Exercises: Standing   Hip Abduction Both;2 sets;10 reps    Hip Extension Both;2 sets;10 reps;Knee straight    Lateral Step Up Both;1 set;10 reps    Forward Step Up Both;2 sets;10 reps;Hand Hold: 0;Step Height: 6"    Step Down --             PT Education - 12/26/19 0858    Education Details Discussed purpose and technique of interventions throughout session. Recommended book for  education on positioning and body mechanics for home use.    Person(s) Educated Patient    Methods Explanation;Handout    Comprehension Verbalized understanding            PT Short Term Goals - 12/26/19 0847      PT SHORT TERM GOAL #1   Title Patient will be independent with initial HEP and self-management strategies to improve functional outcomes    Status Achieved             PT Long Term Goals - 12/26/19 0848      PT LONG TERM GOAL #1   Title Patient will improve FOTO score by 10% to indicate improvement in functional outcomes    Baseline --    Time 4    Period Weeks    Status On-going      PT LONG TERM GOAL #2   Title Patient will report at least 60% overall improvement in subjective complaint to indicate improvement in ability to perform ADLs.    Baseline --    Time 4    Period Weeks    Status On-going      PT LONG TERM GOAL #3    Title Patient will improve lumbar AROM by 25% in all restricted planes for improved ability to perform functional mobility tasks and ADLs.    Baseline --    Time 4    Period Weeks    Status Achieved             Plan - 12/26/19 0847    Clinical Impression Statement Patient tolerated session well today. Progressed core supine exercises and standing strengthening and dynamic strengthening, lifting at medicine ball from a 12" height and balance training. Patient did have an increase in radicular pain post STS w/ weighted ball and lifting medicine ball; patient instructed to complete REIS for symptom control with a decrease in radicular symptoms. Recommended book for patient to read on body mechanics, positioning in everyday life statically and dynamically and low back exercises for radicular symptom centralization and control. Continue with current plan, progress as able.    Examination-Activity Limitations Lift;Stand;Locomotion Level;Bend;Stairs;Squat    Examination-Participation Restrictions Cleaning;Community Activity;V[facility] Leaver [facility]    Stability/Clinical Decision Making Stable/Uncomplicated    Rehab Potential Good    PT Frequency 2x / week    PT Duration 4 weeks    PT Treatment/Interventions ADLs/Self Care Home Management;Aquatic Therapy;Biofeedback;Cryotherapy;Ultrasound;Parrafin;Fluidtherapy;Therapeutic activities;Patient/family education;Manual techniques;Manual lymph drainage;Splinting;Energy conservation;Spinal Manipulations;Dry needling;Passive range of motion;Scar mobilization;Compression bandaging;Taping;Joint Manipulations;Vasopneumatic Device;Orthotic Fit/Training;Neuromuscular re-education;Balance training;Therapeutic exercise;Gait training;DME Instruction;Contrast Bath;Electrical Stimulation;Iontophoresis 92m/ml Dexamethasone;Moist Heat;Traction;Functional mobility training;Stair training    PT Next Visit Plan Continue to progress hip and cores strength as tolerated. Continue to  moniter response to extension based exercise. Continue with standing and functional strength. Monitor response to med ball to squats and lifting from 12" height.    PT Home Exercise Plan 8//1/21: ab set; bed mobility, back extension, knee to chest, hamstring stretch, bridge. 8/24 press up 12/19/19: heel raise, standing hip abd/ ext, chair squat; 12/26/19 - body mechanics, centralization book    Consulted and Agree with Plan of Care Patient           Patient will benefit from skilled therapeutic intervention in order to improve the following deficits and impairments:  Abnormal gait, Pain, Improper body mechanics, Decreased mobility, Decreased range of motion, Decreased strength, Hypomobility, Impaired flexibility, Decreased activity tolerance  Visit Diagnosis: Pain in left leg  Radiculopathy, lumbar region     Problem List Patient Active  Problem List   Diagnosis Date Noted  . Celiac disease 02/20/2019  . RUQ abdominal pain 11/15/2018  . Nausea without vomiting 11/07/2018  . Abdominal pain, epigastric 11/07/2018    Floria Raveling. Hartnett-Rands, MS, PT Per [facility] #[id number] 12/26/2019, 12:40 PM  [place] [address] [place], [place], [address] Phone: [phone]   Fax:  [phone]  Name: SAMAYAH NOVINGER MRN: [id number] Date of Birth: [birth year]

## 2019-12-31 ENCOUNTER — Ambulatory Visit (HOSPITAL_COMMUNITY): Payer: BC Managed Care – PPO | Admitting: Physical Therapy

## 2020-01-02 ENCOUNTER — Encounter (HOSPITAL_COMMUNITY): Payer: Self-pay | Admitting: Physical Therapy

## 2020-01-02 ENCOUNTER — Ambulatory Visit (HOSPITAL_COMMUNITY): Payer: BC Managed Care – PPO | Admitting: Physical Therapy

## 2020-01-02 ENCOUNTER — Other Ambulatory Visit: Payer: Self-pay

## 2020-01-02 DIAGNOSIS — M79605 Pain in left leg: Secondary | ICD-10-CM

## 2020-01-02 DIAGNOSIS — M5416 Radiculopathy, lumbar region: Secondary | ICD-10-CM

## 2020-01-02 NOTE — Therapy (Signed)
[place], [place], [address] Phone: [phone]   Fax:  [phone]  Physical Therapy Treatment  Patient Details  Name: Rhonda Sullivan MRN: [id number] Date of Birth: 09/05/1955 Referring Provider (PT): Suella Broad MD   PHYSICAL THERAPY DISCHARGE SUMMARY  Visits from Start of Care: 10  Current functional level related to goals / functional outcomes: See below    Remaining deficits: See below    Education / Equipment: See assessment  Plan: Patient agrees to discharge.  Patient goals were met. Patient is being discharged due to meeting the stated rehab goals.  ?????      Encounter Date: 01/02/2020   PT End of Session - 01/02/20 0858    Visit Number 10    Number of Visits 15    Date for PT Re-Evaluation 01/10/20    Authorization Type BCBS COMM    Progress Note Due on Visit 15    PT Start Time 0904    PT Stop Time 0936    PT Time Calculation (min) 32 min    Activity Tolerance Patient tolerated treatment well    Behavior During Therapy [facility] for tasks assessed/performed           Past Medical History:  Diagnosis Date  . Angio-edema   . GERD (gastroesophageal reflux disease)     Past Surgical History:  Procedure Laterality Date  . ABDOMINAL HYSTERECTOMY    . BIOPSY  12/06/2018   Procedure: BIOPSY;  Surgeon: Rogene Houston, MD;  [facility];  Service: Endoscopy;;  Duodenal Biopsies fopr celiac disease   . ESOPHAGOGASTRODUODENOSCOPY N/A 12/06/2018   Procedure: ESOPHAGOGASTRODUODENOSCOPY (EGD);  Surgeon: Rogene Houston, MD;  [facility];  Service: Endoscopy;  Laterality: N/A;  3:00  . POLYPECTOMY  12/06/2018   Procedure: POLYPECTOMY;  Surgeon: Rogene Houston, MD;  [facility];  Service: Endoscopy;;  gastric polyps biopsy     There were no vitals filed for this visit.   Subjective Assessment - 01/02/20 0911    Subjective Patient says she is doing much better  and feels like she knows now what to do for her pain. Patient states she has been well managing symptoms and would like today to be her last day as she feels ready for DC from therapy.    Limitations Lifting;House hold activities;Standing;Walking    Diagnostic tests MRI    Patient Stated Goals No pain    Currently in Pain? No/denies    Pain Onset More than a month ago              [facility] PT Assessment - 01/02/20 0001      Assessment   Medical Diagnosis Lumbar spinal stenosis     Referring Provider (PT) Suella Broad MD     Prior Therapy No       Precautions   Precautions None      Restrictions   Weight Bearing Restrictions No      Balance Screen   Has the patient fallen in the past 6 months No      [place] residence    Living Arrangements Spouse/significant other      Prior Function   Level of Independence Independent      Cognition   Overall Cognitive Status Within Functional Limits for tasks assessed      Observation/Other Assessments   Focus on Therapeutic Outcomes (FOTO)  33% limited  was 41% limited      AROM   Lumbar Flexion WFL   was 25% limited    Lumbar Extension 25% limited     Lumbar - Right Side Bend WFL     Lumbar - Left Side Bend WFL     Lumbar - Right Rotation [facility]    Lumbar - Left Rotation [facility]                                  PT Education - 01/02/20 0911    Education Details on HEP review and transition to DC    Person(s) Educated Patient    Methods Explanation    Comprehension Verbalized understanding            PT Short Term Goals - 12/26/19 0847      PT SHORT TERM GOAL #1   Title Patient will be independent with initial HEP and self-management strategies to improve functional outcomes    Status Achieved             PT Long Term Goals - 01/02/20 0959      PT LONG TERM GOAL #1   Title Patient will improve FOTO score by 10% to indicate improvement in functional  outcomes    Baseline 15% improved    Time 4    Period Weeks    Status Achieved      PT LONG TERM GOAL #2   Title Patient will report at least 60% overall improvement in subjective complaint to indicate improvement in ability to perform ADLs.    Baseline Reports 70% improved    Time 4    Period Weeks    Status Achieved      PT LONG TERM GOAL #3   Title Patient will improve lumbar AROM by 25% in all restricted planes for improved ability to perform functional mobility tasks and ADLs.    Baseline See AROM    Time 4    Period Weeks    Status Achieved                 Plan - 01/02/20 1005    Clinical Impression Statement Patient has made good progress and has currently met all therapy goals. Patient with no pain at rest, and demos significant improvement in pain free lumbar AROM. Patient also reports significant improvement in ability to perform ADLs with little to no pain. Discussed transition to HEP for DC today. Performed HEP review and educated patient on extension-based exercise program with incorporating return to pain free flexion. Answered all patient questions. Patient instructed to follow up with therapy services with any further questions or concerns.    Examination-Activity Limitations Lift;Stand;Locomotion Level;Bend;Stairs;Squat    Examination-Participation Restrictions Cleaning;Community Activity;Dorita Sciara    Stability/Clinical Decision Making Stable/Uncomplicated    Rehab Potential Good    PT Frequency --    PT Duration --    PT Treatment/Interventions ADLs/Self Care Home Management;Aquatic Therapy;Biofeedback;Cryotherapy;Ultrasound;Parrafin;Fluidtherapy;Therapeutic activities;Patient/family education;Manual techniques;Manual lymph drainage;Splinting;Energy conservation;Spinal Manipulations;Dry needling;Passive range of motion;Scar mobilization;Compression bandaging;Taping;Joint Manipulations;Vasopneumatic Device;Orthotic Fit/Training;Neuromuscular  re-education;Balance training;Therapeutic exercise;Gait training;DME Instruction;Contrast Bath;Electrical Stimulation;Iontophoresis 59m/ml Dexamethasone;Moist Heat;Traction;Functional mobility training;Stair training    PT Next Visit Plan DC to HEP    PT Home Exercise Plan 8//1/21: ab set; bed mobility, back extension, knee to chest, hamstring stretch, bridge. 8/24 press up 12/19/19: heel raise, standing hip abd/ ext, chair squat; 12/26/19 - body mechanics, centralization book    Consulted and Agree  with Plan of Care Patient           Patient will benefit from skilled therapeutic intervention in order to improve the following deficits and impairments:  Abnormal gait, Pain, Improper body mechanics, Decreased mobility, Decreased range of motion, Decreased strength, Hypomobility, Impaired flexibility, Decreased activity tolerance  Visit Diagnosis: Pain in left leg  Radiculopathy, lumbar region     Problem List Patient Active Problem List   Diagnosis Date Noted  . Celiac disease 02/20/2019  . RUQ abdominal pain 11/15/2018  . Nausea without vomiting 11/07/2018  . Abdominal pain, epigastric 11/07/2018   10:13 AM, 01/02/20 Josue Hector PT DPT  Physical Therapist with [facility]  [phone] [place] [address] [place], [place], [address] Phone: [phone]   Fax:  [phone]  Name: HANALEI GLACE MRN: [id number] Date of Birth: March 28, 1956

## 2020-01-07 ENCOUNTER — Encounter (HOSPITAL_COMMUNITY): Payer: BC Managed Care – PPO | Admitting: Physical Therapy

## 2020-01-09 ENCOUNTER — Encounter (HOSPITAL_COMMUNITY): Payer: BC Managed Care – PPO | Admitting: Physical Therapy

## 2020-01-30 ENCOUNTER — Telehealth: Payer: Self-pay | Admitting: Allergy & Immunology

## 2020-01-30 DIAGNOSIS — T7800XD Anaphylactic reaction due to unspecified food, subsequent encounter: Secondary | ICD-10-CM

## 2020-01-30 NOTE — Telephone Encounter (Signed)
Called to inform patient that the labs have been ordered and she can go get them drawn tomorrow. Patient expressed understanding.

## 2020-01-30 NOTE — Telephone Encounter (Signed)
Dr. Ernst Bowler please advise.

## 2020-01-30 NOTE — Telephone Encounter (Signed)
Labs ordered. Can someone please call the patient to let her know?   Salvatore Marvel, MD [facility] [place] [facility] [place]

## 2020-01-30 NOTE — Telephone Encounter (Signed)
Patient called and needs to go to [facility] in [place] to get a alpha gal blood work today because her ins end tomorrow. [phone].

## 2020-02-05 ENCOUNTER — Ambulatory Visit: Payer: BC Managed Care – PPO | Admitting: Allergy & Immunology

## 2020-02-07 LAB — ALPHA-GAL PANEL
Alpha Gal IgE*: 6.91 kU/L — ABNORMAL HIGH (ref <0.10)
Beef (Bos spp) IgE: 2.11 kU/L — ABNORMAL HIGH (ref <0.35)
Class Interpretation: 1
Class Interpretation: 2
Class Interpretation: 2
Lamb/Mutton (Ovis spp) IgE: 0.66 kU/L — ABNORMAL HIGH (ref <0.35)
Pork (Sus spp) IgE: 0.8 kU/L — ABNORMAL HIGH (ref <0.35)

## 2020-05-06 ENCOUNTER — Ambulatory Visit: Payer: BC Managed Care – PPO | Admitting: Rheumatology

## 2020-10-14 ENCOUNTER — Ambulatory Visit
Admission: RE | Admit: 2020-10-14 | Discharge: 2020-10-14 | Disposition: A | Discharge status: Home / Self Care | Payer: Medicare Other | Source: Ambulatory Visit | Attending: Internal Medicine | Admitting: Internal Medicine

## 2020-10-14 ENCOUNTER — Ambulatory Visit (INDEPENDENT_AMBULATORY_CARE_PROVIDER_SITE_OTHER): Payer: Medicare Other

## 2020-10-14 ENCOUNTER — Other Ambulatory Visit: Payer: Self-pay

## 2020-10-14 VITALS — BP 134/74 | HR 70 | Temp 98.1°F | Resp 17

## 2020-10-14 DIAGNOSIS — M79672 Pain in left foot: Secondary | ICD-10-CM | POA: Diagnosis not present

## 2020-10-14 DIAGNOSIS — S92502A Displaced unspecified fracture of left lesser toe(s), initial encounter for closed fracture: Secondary | ICD-10-CM | POA: Diagnosis not present

## 2020-10-14 DIAGNOSIS — S92515A Nondisplaced fracture of proximal phalanx of left lesser toe(s), initial encounter for closed fracture: Secondary | ICD-10-CM

## 2020-10-14 MED ORDER — IBUPROFEN 600 MG PO TABS: 600.0000 mg | ORAL_TABLET | Freq: Four times a day (QID) | ORAL | 0 refills | Status: DC | PRN

## 2020-10-14 NOTE — ED Provider Notes (Signed)
[facility]    CSN: [id number] Arrival date & time: 10/14/20  1055      History   Chief Complaint No chief complaint on file.   HPI Rhonda Sullivan is a 65 y.o. female comes to the urgent care with left foot pain of 1 week duration.  Patient hit her left little toe against a chair a week ago.  Patient initially described the pain as sharp with moderate severity.  She was able to ambulate.  In the subsequent days patient started experiencing worsening pain with numbness of the left little toe and the swelling of the left foot.  No bruising noted.  Pain is aggravated by bearing weight.  Over-the-counter pain medication did not help with her symptoms.  She decided to come to urgent care to be evaluated. HPI  Past Medical History:  Diagnosis Date   Angio-edema    GERD (gastroesophageal reflux disease)     Patient Active Problem List   Diagnosis Date Noted   Celiac disease 02/20/2019   RUQ abdominal pain 11/15/2018   Nausea without vomiting 11/07/2018   Abdominal pain, epigastric 11/07/2018    Past Surgical History:  Procedure Laterality Date   ABDOMINAL HYSTERECTOMY     BIOPSY  12/06/2018   Procedure: BIOPSY;  Surgeon: Rogene Houston, MD;  [facility];  Service: Endoscopy;;  Duodenal Biopsies fopr celiac disease    ESOPHAGOGASTRODUODENOSCOPY N/A 12/06/2018   Procedure: ESOPHAGOGASTRODUODENOSCOPY (EGD);  Surgeon: Rogene Houston, MD;  [facility];  Service: Endoscopy;  Laterality: N/A;  3:00   POLYPECTOMY  12/06/2018   Procedure: POLYPECTOMY;  Surgeon: Rogene Houston, MD;  [facility];  Service: Endoscopy;;  gastric polyps biopsy     OB History   No obstetric history on file.      Home Medications    Prior to Admission medications   Medication Sig Start Date End Date Taking? Authorizing Provider  ibuprofen (ADVIL) 600 MG tablet Take 1 tablet (600 mg total) by mouth every 6 (six) hours as needed. 10/14/20  Yes Nachelle Negrette,  Myrene Galas, MD  ACCU-CHEK GUIDE test strip USE TO TEST TWICE A DAY 09/05/19   [provider]  Alpha-Lipoic Acid 600 MG CAPS Take by mouth daily.    [provider]  Calcium Citrate (CITRACAL PO) Take by mouth in the morning, at noon, and at bedtime.    [provider]  Chromium Picolinate 1000 MCG TABS Take by mouth. Patient states that she takes 1 by mouth every other day.    [provider]  Magnesium 400 MG CAPS Take 400 mg by mouth daily. Patient states that she takes for cramps.    [provider]  Multiple Vitamin (MULTI VITAMIN DAILY PO) Take by mouth 2 (two) times daily. Advanced MVI    [provider]  NON FORMULARY Terry Naturally Curamed 700 mg daily.    [provider]  ondansetron (ZOFRAN) 4 MG tablet TAKE 1 TABLET BY MOUTH EVERY 8 HOURS AS NEEDED FOR NAUSEA AND VOMITING 12/25/19   Laurine Blazer B, PA-C  OVER THE COUNTER MEDICATION Bone Strengthening - Patient states that she takes 5 per day Included Calcium 1300 mg with  Vitamin D and K.    [provider]  pantoprazole (PROTONIX) 40 MG tablet Take 1 tablet (40 mg total) by mouth daily. 12/18/19 12/17/20  Laurine Blazer B, PA-C  Probiotic Product (DAILY PROBIOTIC PO) Take by mouth. Patient states that this is weight  Management Probiotic - Patient takes one by mouth daily.    [provider]  Vitamin Mixture (ESTER-C PO) Take 2,000 mg by mouth daily.    [provider]    Family History Family History  Problem Relation Age of Onset   Allergic rhinitis Mother    Asthma Neg Hx    Eczema Neg Hx    Urticaria Neg Hx     Social History Social History   Tobacco Use   Smoking status: Never   Smokeless tobacco: Never  Vaping Use   Vaping Use: Never used  Substance Use Topics   Alcohol use: No   Drug use: Never     Allergies   Alpha-gal and Other   Review of Systems Review of Systems  Musculoskeletal:  Positive for arthralgias and  joint swelling. Negative for myalgias.  Neurological: Negative.     Physical Exam Triage Vital Signs ED Triage Vitals  Enc Vitals Group     BP 10/14/20 1110 134/74     Pulse Rate 10/14/20 1110 70     Resp 10/14/20 1110 17     Temp 10/14/20 1110 98.1 F (36.7 C)     Temp Source 10/14/20 1110 Oral     SpO2 10/14/20 1110 96 %     Weight --      Height --      Head Circumference --      Peak Flow --      Pain Score 10/14/20 1111 5     Pain Loc --      Pain Edu? --      Excl. in [place]? --    No data found.  Updated Vital Signs BP 134/74 (BP Location: Right Arm)   Pulse 70   Temp 98.1 F (36.7 C) (Oral)   Resp 17   SpO2 96%   Visual Acuity Right Eye Distance:   Left Eye Distance:   Bilateral Distance:    Right Eye Near:   Left Eye Near:    Bilateral Near:     Physical Exam Vitals and nursing note reviewed.  Constitutional:      General: She is not in acute distress.    Appearance: Normal appearance. She is not ill-appearing.  Musculoskeletal:     Comments: Swelling of the left foot.  Left fifth toe is swollen.  The other toes are not swollen.  Tenderness on palpation over the metatarsophalangeal joint of the left fifth toe  Neurological:     Mental Status: She is alert.     UC Treatments / Results  Labs (all labs ordered are listed, but only abnormal results are displayed) Labs Reviewed - No data to display  EKG   Radiology DG Foot Complete Left  Result Date: 10/14/2020 CLINICAL DATA:  Injury of the fifth toe 1 week ago. Pain and swelling. EXAM: LEFT FOOT - COMPLETE 3+ VIEW COMPARISON:  None. FINDINGS: Fracture of the proximal metaphysis of the proximal phalanx of the small toe. No apparent angulation or displacement. No extension to the articular surface. Remainder the bones of the foot appear normal. IMPRESSION: Nondisplaced fracture of the proximal metaphysis of the proximal phalanx of the small toe. Electronically Signed   By: Nelson Chimes M.D.   On:  10/14/2020 11:44    Procedures Procedures (including critical care time)  Medications Ordered in UC Medications - No data to display  Initial Impression / Assessment and Plan / UC Course  I have reviewed the triage vital signs and the  nursing notes.  Pertinent labs & imaging results that were available during my care of the patient were reviewed by me and considered in my medical decision making (see chart for details).     1.  Closed fracture of the proximal metaphysis of the proximal phalanx of the left fifth toe: Postop boot Elevation of the left foot NSAIDs as needed for pain Follow-up with primary care physician Return precautions given. Final Clinical Impressions(s) / UC Diagnoses   Final diagnoses:  Closed fracture of phalanx of left fifth toe, initial encounter     Discharge Instructions      Use postop shoe has recommended Elevation of the left leg at the end of the day, while sleeping or when sedentary If symptoms worsen please return to the urgent care to be reevaluated. Follow-up with primary care physician in 1 to 2 weeks.   ED Prescriptions     Medication Sig Dispense Auth. Provider   ibuprofen (ADVIL) 600 MG tablet Take 1 tablet (600 mg total) by mouth every 6 (six) hours as needed. 30 tablet Tereso Unangst, Myrene Galas, MD      PDMP not reviewed this encounter.   Chase Picket, MD 10/14/20 [phone]

## 2020-10-14 NOTE — ED Triage Notes (Signed)
Last week pt hung her little toe on a chair leg. Now having pain ans swelling on top of foot.

## 2020-10-14 NOTE — Discharge Instructions (Addendum)
Use postop shoe has recommended Elevation of the left leg at the end of the day, while sleeping or when sedentary If symptoms worsen please return to the urgent care to be reevaluated. Follow-up with primary care physician in 1 to 2 weeks.

## 2020-12-14 IMAGING — CT CT ABDOMEN AND PELVIS WITH CONTRAST
2 of 5 series · 16 of 46 positions shown, 18 images · IV contrast (Isovue)
Comparison: None.

CLINICAL DATA: Nausea, vomiting, abdominal pain

EXAM:
CT ABDOMEN AND PELVIS WITH CONTRAST
TECHNIQUE: Multidetector CT imaging of the abdomen and pelvis was performed
using the standard protocol following bolus administration of
intravenous contrast.
CONTRAST:  100mL OMNIPAQUE IOHEXOL 300 MG/ML  SOLN

[Series 2: axial st · axial · 0.67mm/px · z∈[+949,+1329]mm · 13 of 88 slices shown, 15 images]
[im 6/88  soft-tissue]
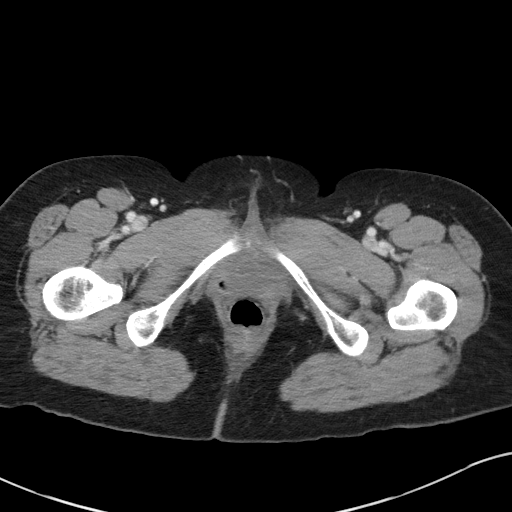
[im 6/88  bone]
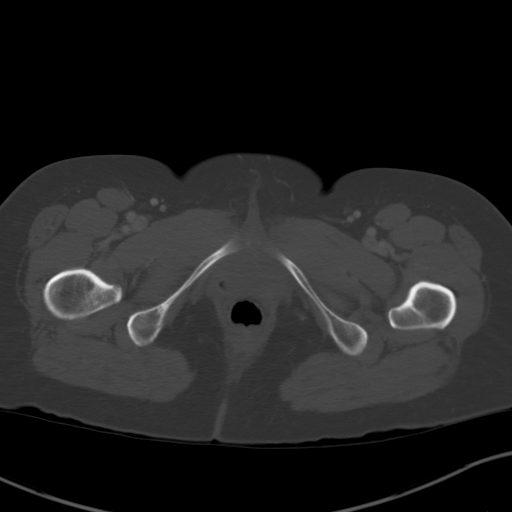
[im 12/88  soft-tissue]
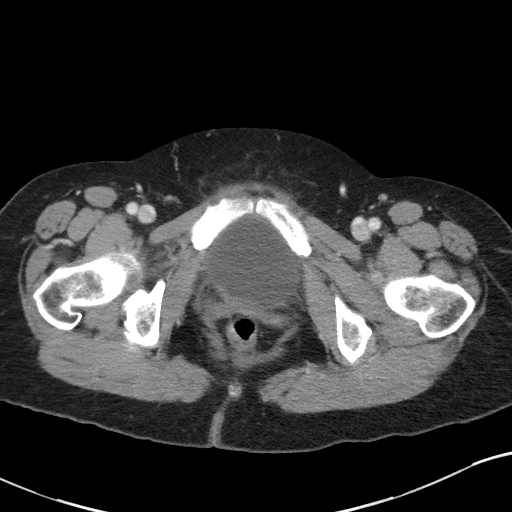
[im 18/88  soft-tissue]
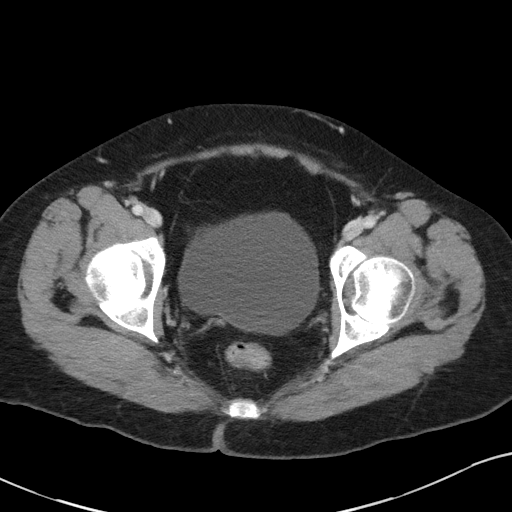
[im 24/88  soft-tissue]
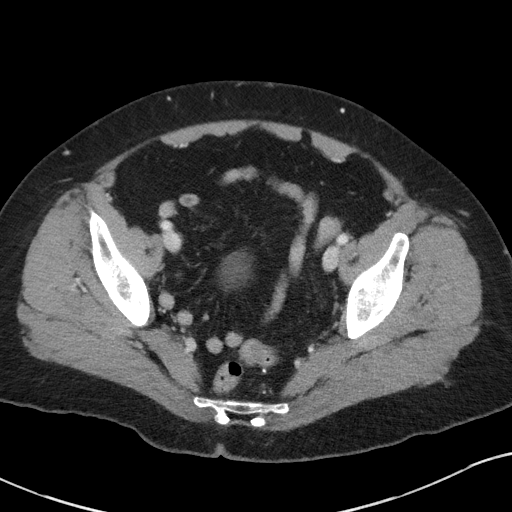
[im 30/88  soft-tissue]
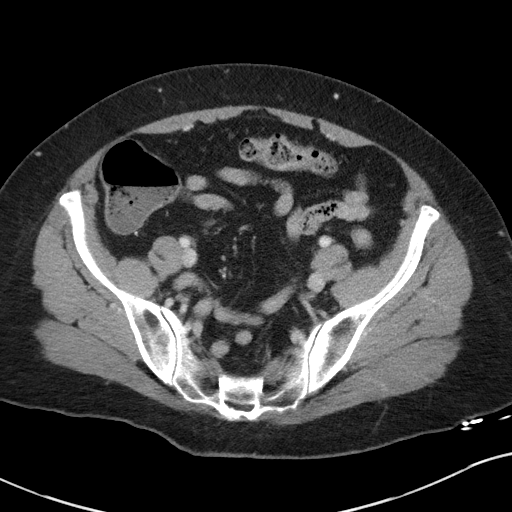
[im 35/88  soft-tissue]
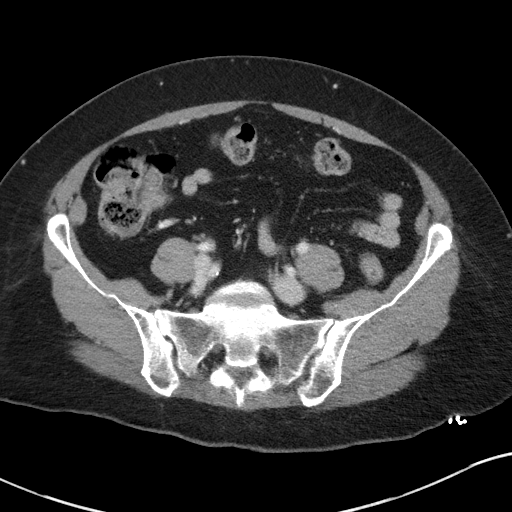
[im 47/88  soft-tissue]
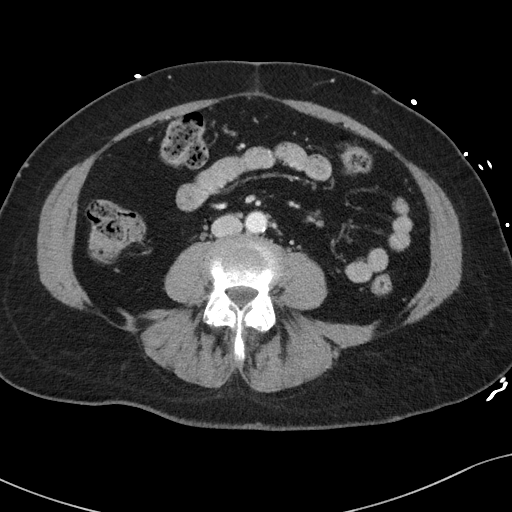
[im 53/88  soft-tissue]
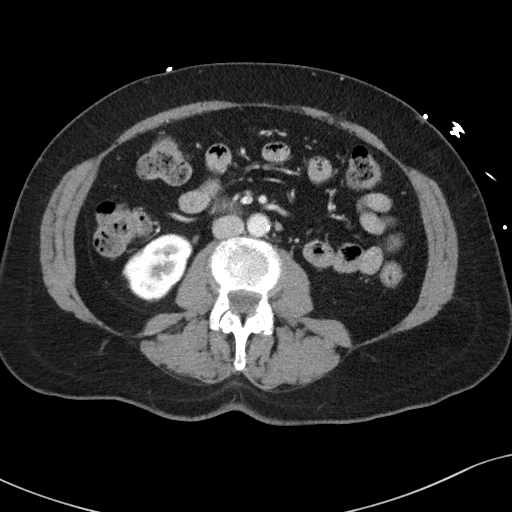
[im 59/88  soft-tissue]
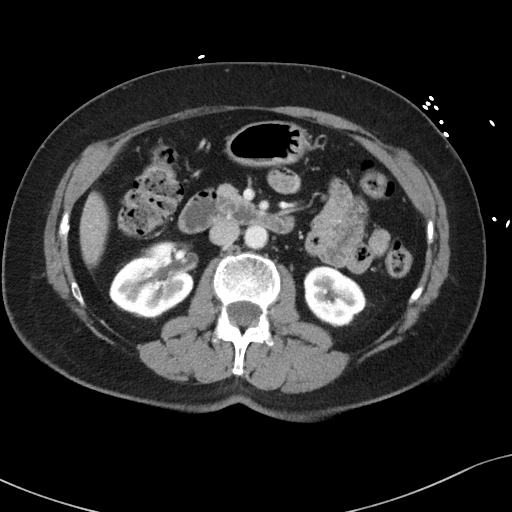
[im 59/88  bone]
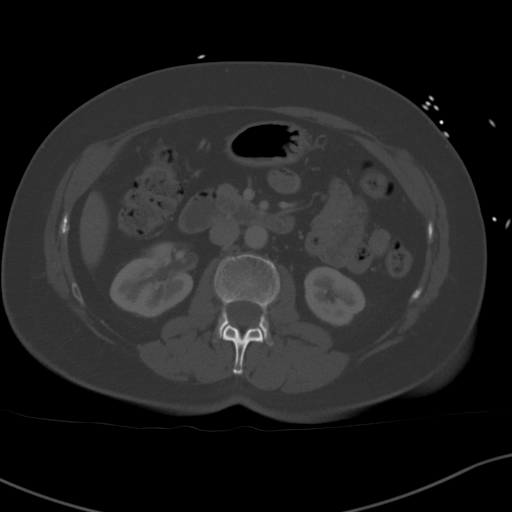
[im 64/88  soft-tissue]
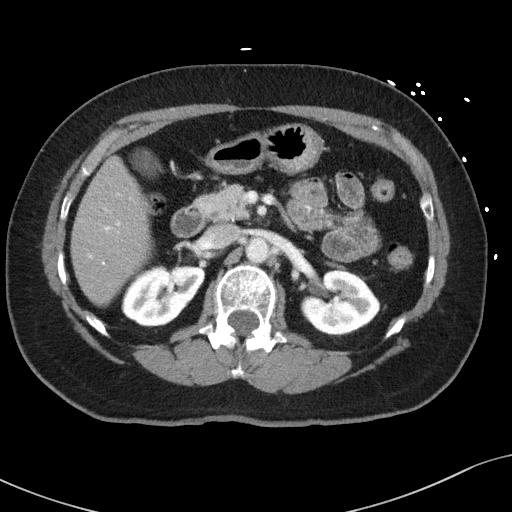
[im 70/88  soft-tissue]
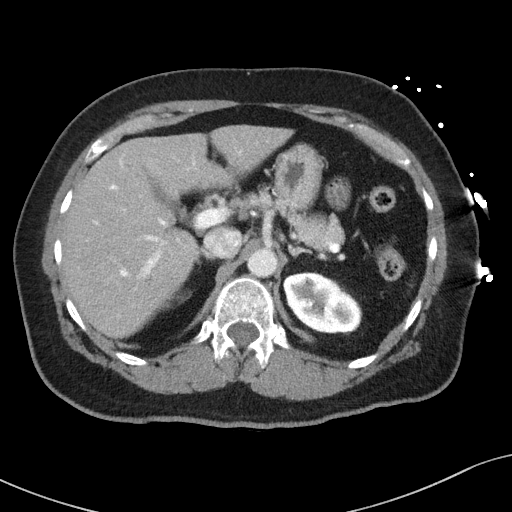
[im 76/88  soft-tissue]
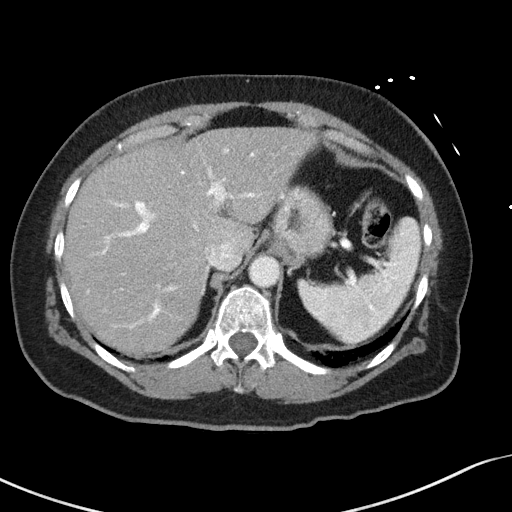
[im 82/88  soft-tissue]
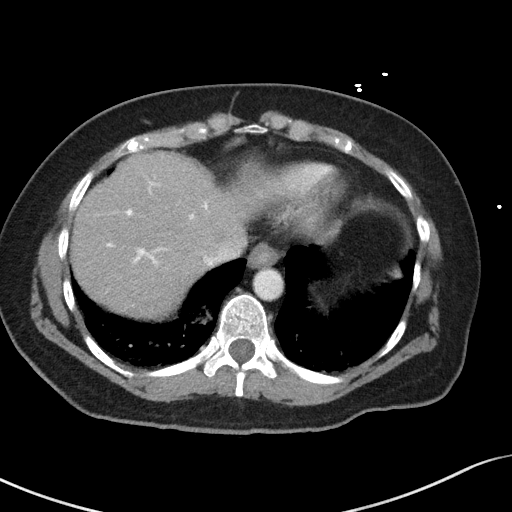

[Series 4: coronal st · coronal · 0.70mm/px · 3 of 90 slices shown]
[im 30/90  soft-tissue]
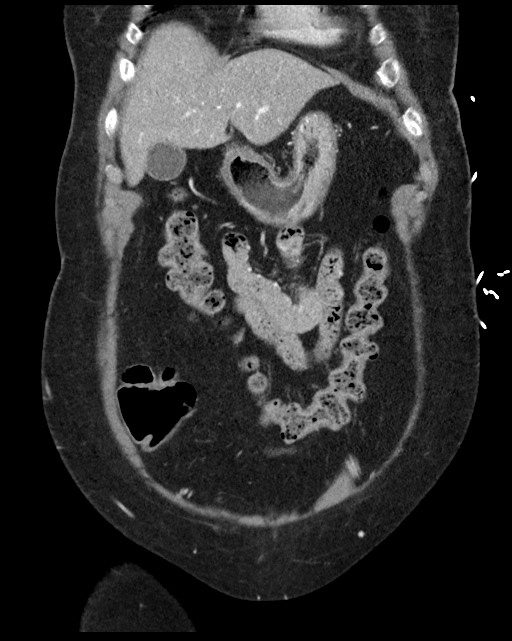
[im 40/90  soft-tissue]
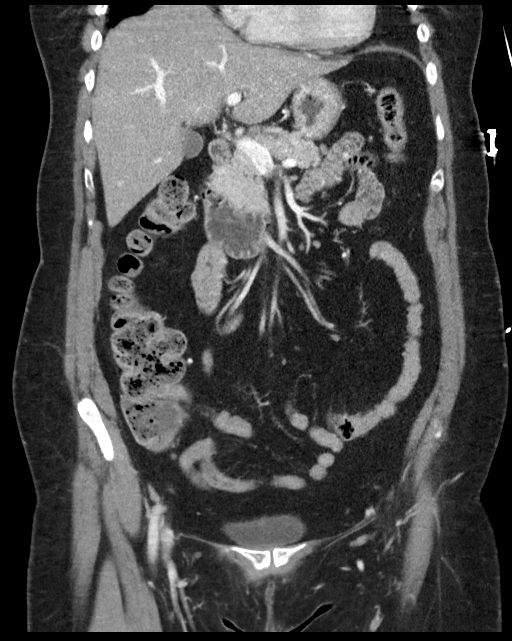
[im 50/90  soft-tissue]
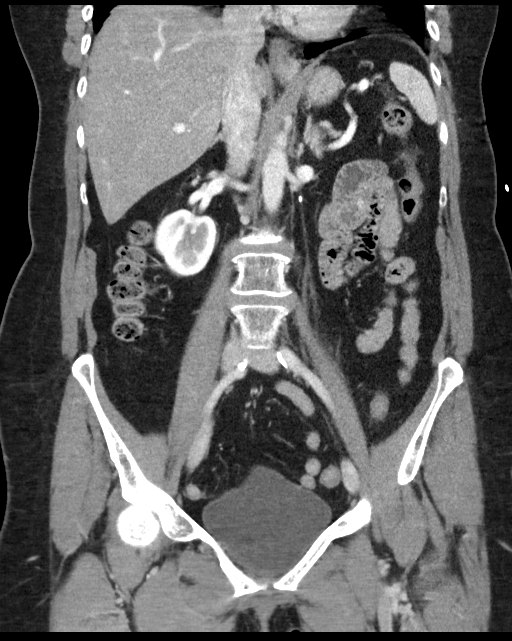

[16 of 46 positions shown; findings below may reference images not displayed]

FINDINGS: Lower chest: Bibasilar partial atelectasis or scarring.

Hepatobiliary: No solid liver abnormality is seen. Hepatic
steatosis. No gallstones, gallbladder wall thickening, or biliary
dilatation.

Pancreas: Unremarkable. No pancreatic ductal dilatation or
surrounding inflammatory changes.

Spleen: Normal in size without significant abnormality.

Adrenals/Urinary Tract: Adrenal glands are unremarkable. Kidneys are
normal, without renal calculi, solid lesion, or hydronephrosis.
Bladder is unremarkable.

Stomach/Bowel: Stomach is within normal limits. Appendix appears
normal. No evidence of bowel wall thickening, distention, or
inflammatory changes. Occasional sigmoid diverticula.

Vascular/Lymphatic: Aortic atherosclerosis. No enlarged abdominal or
pelvic lymph nodes.

Reproductive: Status post hysterectomy.

Other: No abdominal wall hernia or abnormality. No abdominopelvic
ascites.

Musculoskeletal: No acute or significant osseous findings.
IMPRESSION: 1.  No acute CT findings of the abdomen or pelvis to explain pain.

2.  Hepatic steatosis.

3. Other chronic, incidental, and postoperative findings as detailed
above.

## 2020-12-17 ENCOUNTER — Ambulatory Visit (INDEPENDENT_AMBULATORY_CARE_PROVIDER_SITE_OTHER): Payer: BC Managed Care – PPO | Admitting: Gastroenterology

## 2020-12-24 ENCOUNTER — Ambulatory Visit (INDEPENDENT_AMBULATORY_CARE_PROVIDER_SITE_OTHER): Payer: Medicare Other | Admitting: Gastroenterology

## 2020-12-30 IMAGING — US ULTRASOUND ABDOMEN COMPLETE
1 series · 13 of 25 positions shown · non-contrast
Comparison: 10/28/2018

CLINICAL DATA: Right upper quadrant abdominal pain.

EXAM:
ABDOMEN ULTRASOUND COMPLETE

[Series 1: ultrasound abdomen complete · 13 of 129 slices shown]
[im 1/129]
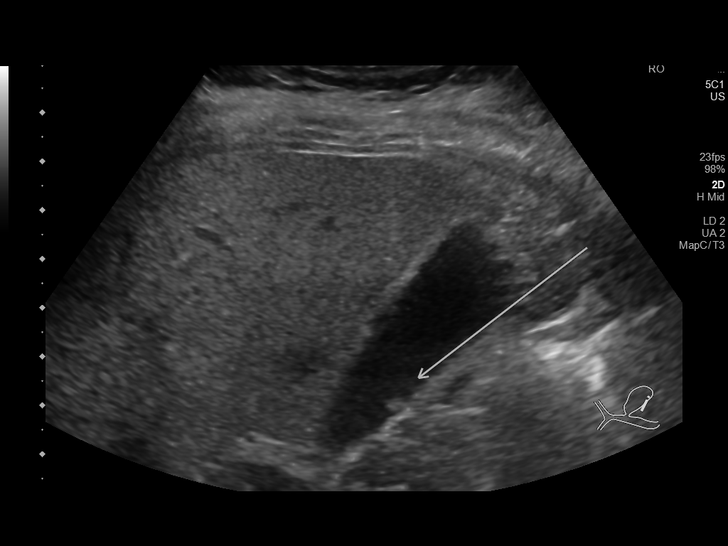
[im 11/129]
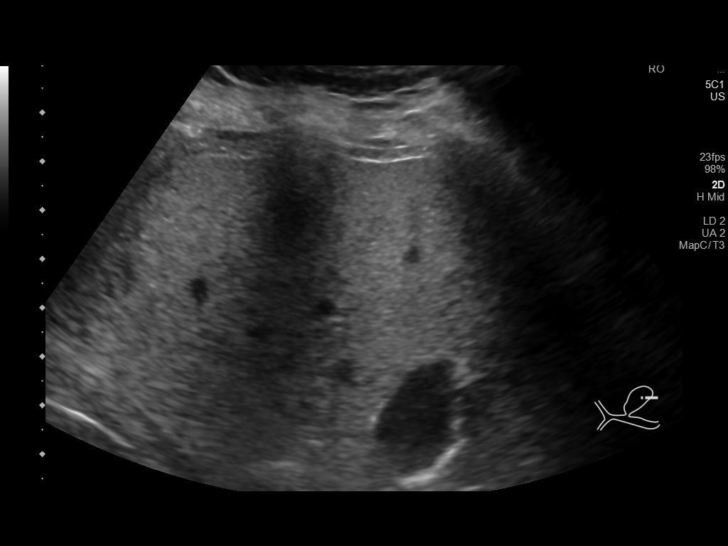
[im 22/129]
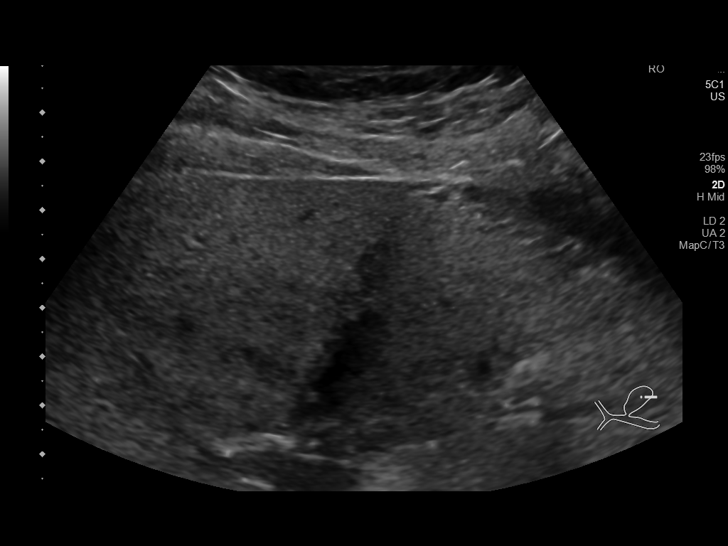
[im 33/129]
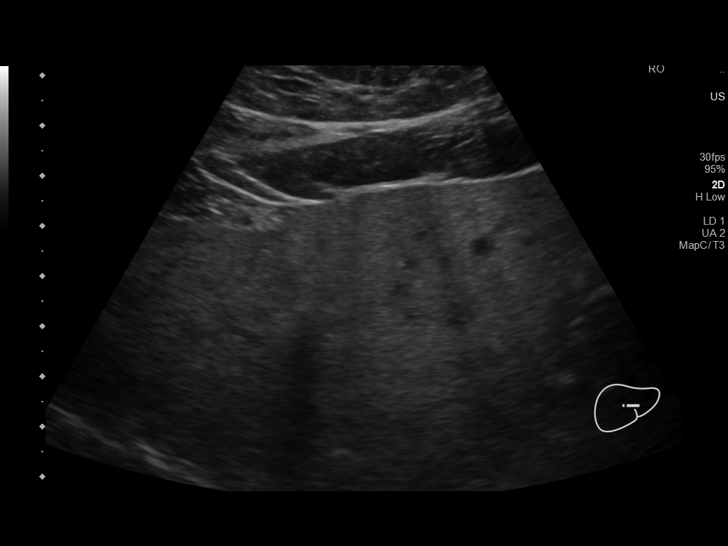
[im 43/129]
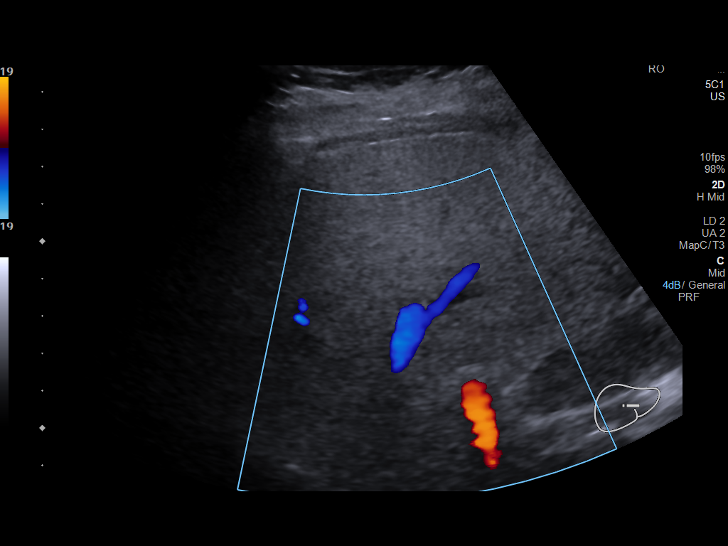
[im 54/129]
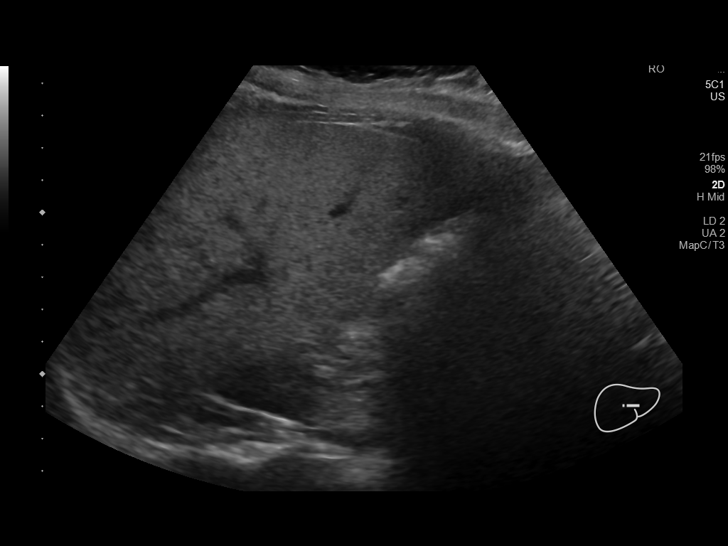
[im 65/129]
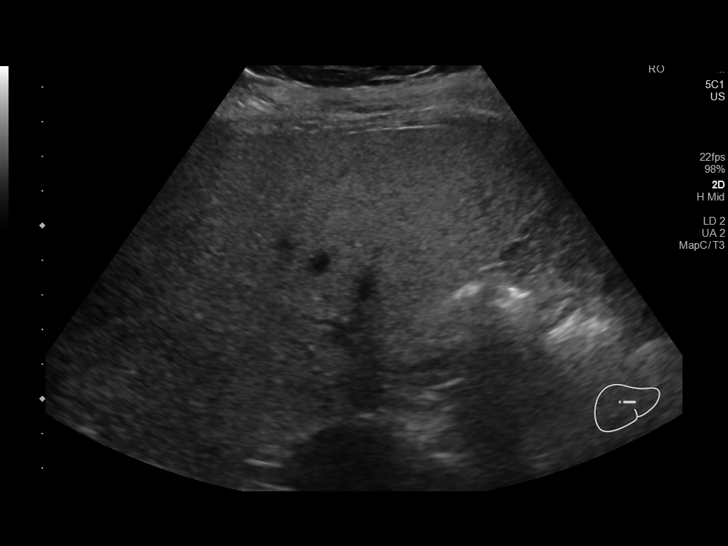
[im 75/129]
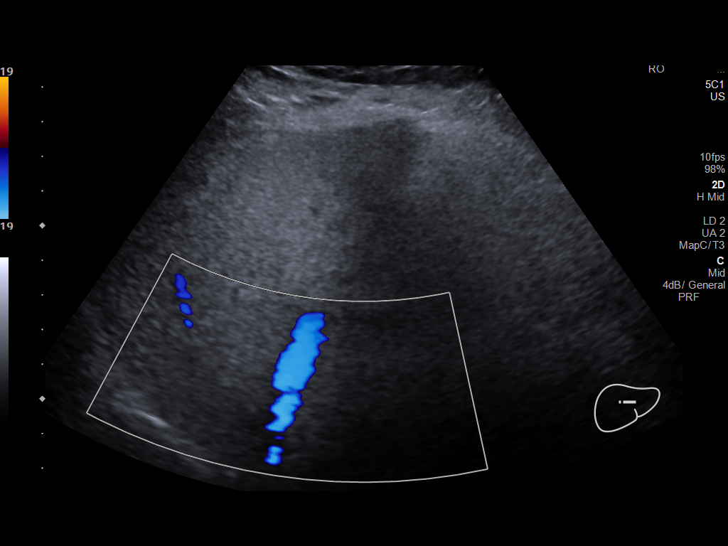
[im 86/129]
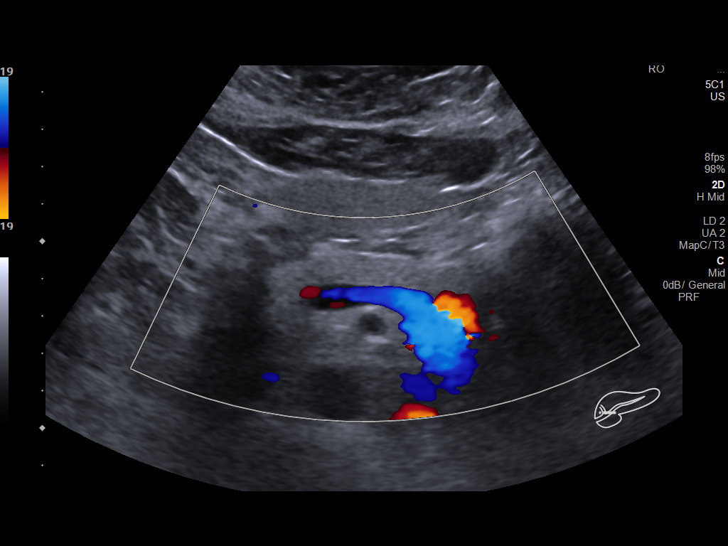
[im 97/129]
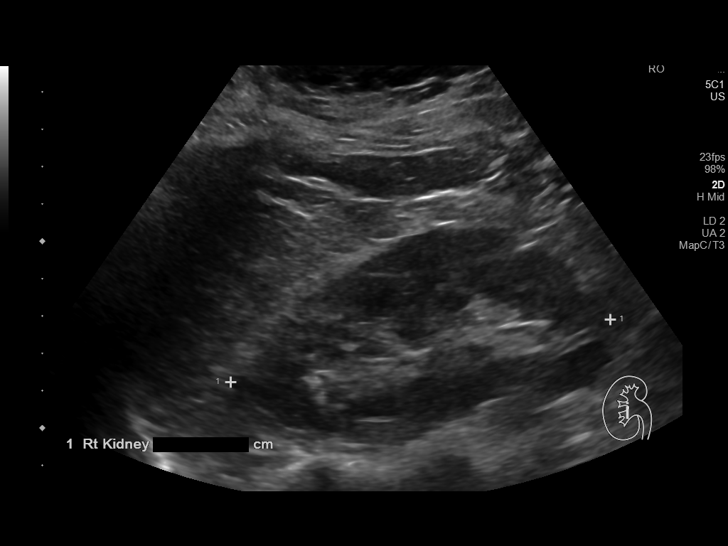
[im 107/129]
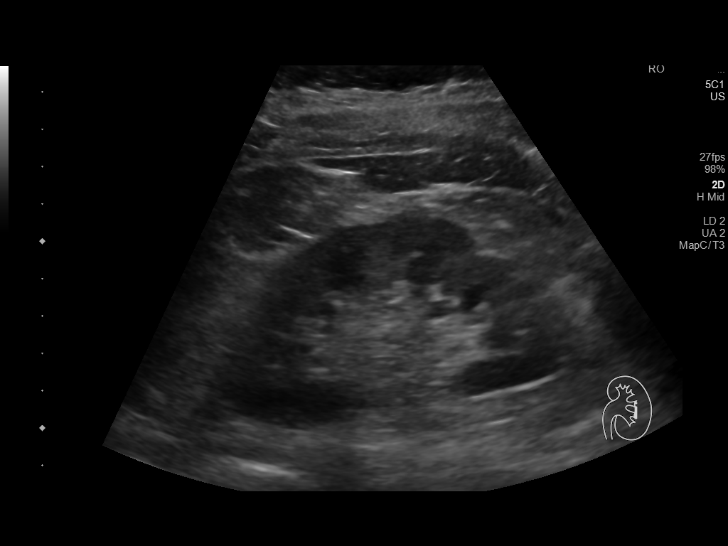
[im 118/129]
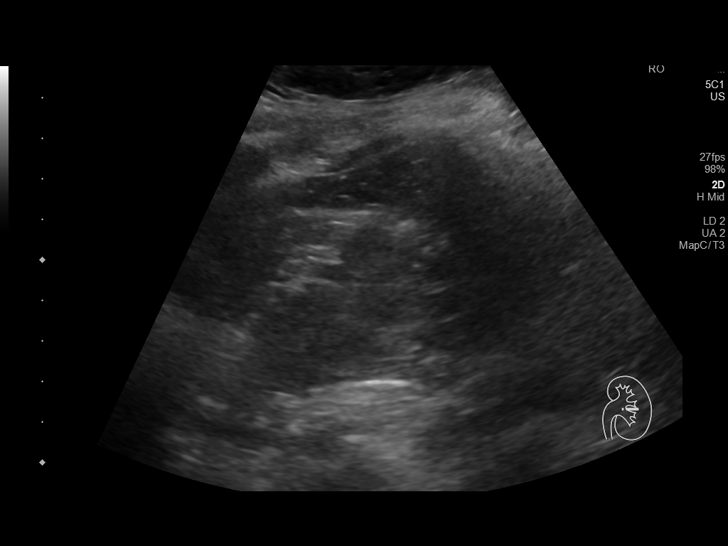
[im 129/129]
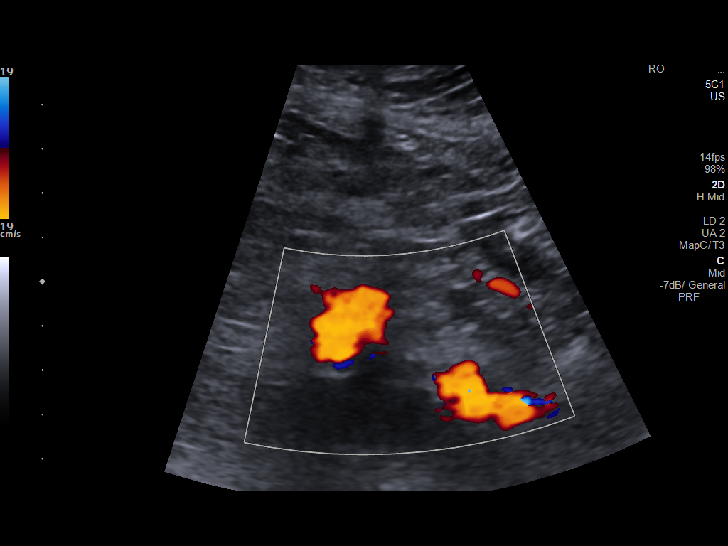

[13 of 25 positions shown; findings below may reference images not displayed]

FINDINGS: Gallbladder: 2 mm gallbladder polyp. Gallbladder sludge. No wall
thickening, pericholecystic fluid, or sonographic Murphy sign.

Common bile duct: Diameter: Normal, 3 mm.

Liver: Moderately increased in echogenicity. Portal vein is patent
on color Doppler imaging with normal direction of blood flow towards
the liver.

IVC: No abnormality visualized.

Pancreas: Visualized portion unremarkable.

Spleen: Size and appearance within normal limits.

Right Kidney: Length: 10.3 cm. Echogenicity within normal limits. No
mass or hydronephrosis visualized.

Left Kidney: Length: 10.1 cm. Echogenicity within normal limits. No
mass or hydronephrosis visualized.

Abdominal aorta: No aneurysm visualized.

Other findings: No ascites.
IMPRESSION: 1. Gallbladder sludge. No evidence of acute cholecystitis or biliary
duct dilatation.
2. 2 mm gallbladder polyp. Per consensus criteria, this can be
presumed benign. This recommendation follows ACR consensus
guidelines: White Paper of the ACR Incidental Findings Committee II
on Gallbladder and Biliary Findings. [HOSPITAL]
3. Hepatic steatosis.

## 2021-02-24 ENCOUNTER — Other Ambulatory Visit (HOSPITAL_COMMUNITY): Payer: Self-pay | Admitting: Family Medicine

## 2021-02-24 DIAGNOSIS — Z1231 Encounter for screening mammogram for malignant neoplasm of breast: Secondary | ICD-10-CM

## 2021-02-24 DIAGNOSIS — E2839 Other primary ovarian failure: Secondary | ICD-10-CM

## 2021-03-03 ENCOUNTER — Ambulatory Visit (HOSPITAL_COMMUNITY)
Admission: RE | Admit: 2021-03-03 | Discharge: 2021-03-03 | Disposition: A | Discharge status: Home / Self Care | Payer: Medicare Other | Source: Ambulatory Visit | Attending: Physician Assistant | Admitting: Physician Assistant

## 2021-03-03 ENCOUNTER — Other Ambulatory Visit: Payer: Self-pay

## 2021-03-03 ENCOUNTER — Other Ambulatory Visit: Payer: Self-pay | Admitting: Physician Assistant

## 2021-03-03 ENCOUNTER — Other Ambulatory Visit (HOSPITAL_COMMUNITY): Payer: Self-pay | Admitting: Physician Assistant

## 2021-03-03 DIAGNOSIS — R110 Nausea: Secondary | ICD-10-CM | POA: Insufficient documentation

## 2021-03-03 DIAGNOSIS — R109 Unspecified abdominal pain: Secondary | ICD-10-CM | POA: Insufficient documentation

## 2021-03-03 DIAGNOSIS — R197 Diarrhea, unspecified: Secondary | ICD-10-CM | POA: Insufficient documentation

## 2021-03-03 DIAGNOSIS — M549 Dorsalgia, unspecified: Secondary | ICD-10-CM

## 2021-03-06 ENCOUNTER — Emergency Department (HOSPITAL_COMMUNITY)
Admission: EM | Admit: 2021-03-06 | Discharge: 2021-03-06 | Disposition: A | Discharge status: Home / Self Care | Payer: Medicare Other | Attending: Emergency Medicine | Admitting: Emergency Medicine

## 2021-03-06 ENCOUNTER — Other Ambulatory Visit: Payer: Self-pay

## 2021-03-06 ENCOUNTER — Emergency Department (HOSPITAL_COMMUNITY): Payer: Medicare Other

## 2021-03-06 ENCOUNTER — Encounter (HOSPITAL_COMMUNITY): Payer: Self-pay

## 2021-03-06 DIAGNOSIS — K529 Noninfective gastroenteritis and colitis, unspecified: Secondary | ICD-10-CM | POA: Insufficient documentation

## 2021-03-06 DIAGNOSIS — R1031 Right lower quadrant pain: Secondary | ICD-10-CM | POA: Diagnosis present

## 2021-03-06 LAB — URINALYSIS, ROUTINE W REFLEX MICROSCOPIC
Bilirubin Urine: NEGATIVE
Glucose, UA: NEGATIVE mg/dL
Hgb urine dipstick: NEGATIVE
Ketones, ur: NEGATIVE mg/dL
Leukocytes,Ua: NEGATIVE
Nitrite: NEGATIVE
Protein, ur: NEGATIVE mg/dL
Specific Gravity, Urine: <1.005 — ABNORMAL LOW (ref 1.005–1.030)
pH: 5.5 (ref 5.0–8.0)

## 2021-03-06 LAB — CBC
HCT: 46.3 % — ABNORMAL HIGH (ref 36.0–46.0)
Hemoglobin: 15.6 g/dL — ABNORMAL HIGH (ref 12.0–15.0)
MCH: 29.1 pg (ref 26.0–34.0)
MCHC: 33.7 g/dL (ref 30.0–36.0)
MCV: 86.4 fL (ref 80.0–100.0)
Platelets: 215 10*3/uL (ref 150–400)
RBC: 5.36 MIL/uL — ABNORMAL HIGH (ref 3.87–5.11)
RDW: 13.7 % (ref 11.5–15.5)
WBC: 14.3 10*3/uL — ABNORMAL HIGH (ref 4.0–10.5)
nRBC: 0 % (ref 0.0–0.2)

## 2021-03-06 LAB — COMPREHENSIVE METABOLIC PANEL
ALT: 31 U/L (ref 0–44)
AST: 40 U/L (ref 15–41)
Albumin: 4.6 g/dL (ref 3.5–5.0)
Alkaline Phosphatase: 70 U/L (ref 38–126)
Anion gap: 10 (ref 5–15)
BUN: 15 mg/dL (ref 8–23)
CO2: 23 mmol/L (ref 22–32)
Calcium: 9.3 mg/dL (ref 8.9–10.3)
Chloride: 107 mmol/L (ref 98–111)
Creatinine, Ser: 0.92 mg/dL (ref 0.44–1.00)
GFR, Estimated: >60 mL/min (ref >60)
Glucose, Bld: 116 mg/dL — ABNORMAL HIGH (ref 70–99)
Potassium: 2.7 mmol/L — CL (ref 3.5–5.1)
Sodium: 140 mmol/L (ref 135–145)
Total Bilirubin: 0.5 mg/dL (ref 0.3–1.2)
Total Protein: 7.2 g/dL (ref 6.5–8.1)

## 2021-03-06 LAB — LIPASE, BLOOD: Lipase: 37 U/L (ref 11–51)

## 2021-03-06 MED ORDER — POTASSIUM CHLORIDE CRYS ER 20 MEQ PO TBCR
40.0000 meq | EXTENDED_RELEASE_TABLET | Freq: Once | ORAL | Status: AC
  Administered 2021-03-06: 40 meq via ORAL
  Filled 2021-03-06: qty 2

## 2021-03-06 MED ORDER — HYDROMORPHONE HCL 1 MG/ML IJ SOLN
0.5000 mg | Freq: Once | INTRAMUSCULAR | Status: AC
  Administered 2021-03-06: 0.5 mg via INTRAVENOUS
  Filled 2021-03-06 (×2): qty 1

## 2021-03-06 MED ORDER — IOHEXOL 300 MG/ML  SOLN
100.0000 mL | Freq: Once | INTRAMUSCULAR | Status: AC | PRN
  Administered 2021-03-06: 100 mL via INTRAVENOUS

## 2021-03-06 MED ORDER — POTASSIUM CHLORIDE CRYS ER 20 MEQ PO TBCR: 20.0000 meq | EXTENDED_RELEASE_TABLET | Freq: Every day | ORAL | 0 refills | Status: DC

## 2021-03-06 MED ORDER — SODIUM CHLORIDE 0.9 % IV BOLUS
1000.0000 mL | Freq: Once | INTRAVENOUS | Status: AC
  Administered 2021-03-06: 1000 mL via INTRAVENOUS

## 2021-03-06 MED ORDER — ONDANSETRON 4 MG PO TBDP: ORAL_TABLET | ORAL | 0 refills | Status: DC

## 2021-03-06 MED ORDER — ONDANSETRON HCL 4 MG/2ML IJ SOLN
4.0000 mg | Freq: Once | INTRAMUSCULAR | Status: AC
  Administered 2021-03-06: 4 mg via INTRAVENOUS
  Filled 2021-03-06: qty 2

## 2021-03-06 MED ORDER — DICYCLOMINE HCL 20 MG PO TABS: ORAL_TABLET | ORAL | 0 refills | Status: DC

## 2021-03-06 MED ORDER — POTASSIUM CHLORIDE 10 MEQ/100ML IV SOLN
10.0000 meq | Freq: Once | INTRAVENOUS | Status: AC
  Administered 2021-03-06: 10 meq via INTRAVENOUS
  Filled 2021-03-06: qty 100

## 2021-03-06 NOTE — ED Notes (Signed)
Pt unable to urinate at this time. States "I've only peed once today"

## 2021-03-06 NOTE — Discharge Instructions (Addendum)
Follow-up with Dr. Jenetta Downer next week.  Drink plenty of fluids.  Stop taking your antibiotics.

## 2021-03-06 NOTE — ED Notes (Signed)
Patient transported to CT 

## 2021-03-06 NOTE — ED Provider Notes (Signed)
[facility] EMERGENCY DEPARTMENT Provider Note   CSN: [id number] Arrival date & time: 03/06/21  2023     History Chief Complaint  Patient presents with   Abdominal Pain    Rhonda Sullivan is a 65 y.o. female.  Patient complains of abdominal cramping nausea diarrhea.  She has been treated with antibiotics for diverticulitis.  The history is provided by the patient and medical records. No language interpreter was used.  Abdominal Pain Pain location:  LUQ and LLQ Pain quality: aching   Pain radiates to:  Does not radiate Pain severity:  Moderate Associated symptoms: no chest pain, no cough, no diarrhea, no fatigue and no hematuria       Past Medical History:  Diagnosis Date   Angio-edema    GERD (gastroesophageal reflux disease)     Patient Active Problem List   Diagnosis Date Noted   Celiac disease 02/20/2019   RUQ abdominal pain 11/15/2018   Nausea without vomiting 11/07/2018   Abdominal pain, epigastric 11/07/2018    Past Surgical History:  Procedure Laterality Date   ABDOMINAL HYSTERECTOMY     BIOPSY  12/06/2018   Procedure: BIOPSY;  Surgeon: Rogene Houston, MD;  Location: [facility];  Service: Endoscopy;;  Duodenal Biopsies fopr celiac disease    ESOPHAGOGASTRODUODENOSCOPY N/A 12/06/2018   Procedure: ESOPHAGOGASTRODUODENOSCOPY (EGD);  Surgeon: Rogene Houston, MD;  Location: [facility];  Service: Endoscopy;  Laterality: N/A;  3:00   POLYPECTOMY  12/06/2018   Procedure: POLYPECTOMY;  Surgeon: Rogene Houston, MD;  Location: [facility];  Service: Endoscopy;;  gastric polyps biopsy      OB History   No obstetric history on file.     Family History  Problem Relation Age of Onset   Allergic rhinitis Mother    Asthma Neg Hx    Eczema Neg Hx    Urticaria Neg Hx     Social History   Tobacco Use   Smoking status: Never   Smokeless tobacco: Never  Vaping Use   Vaping Use: Never used  Substance Use Topics   Alcohol use: No   Drug use: Never     Home Medications Prior to Admission medications   Medication Sig Start Date End Date Taking? Authorizing Provider  dicyclomine (BENTYL) 20 MG tablet Take 1 every 8-12 hours for abdominal cramping 03/06/21  Yes Milton Ferguson, MD  ondansetron (ZOFRAN-ODT) 4 MG disintegrating tablet 75m ODT q4 hours prn nausea/vomit 03/06/21  Yes ZMilton Ferguson MD  potassium chloride SA (KLOR-CON M) 20 MEQ tablet Take 1 tablet (20 mEq total) by mouth daily. 03/06/21  Yes ZMilton Ferguson MD  ACCU-CHEK GUIDE test strip USE TO TEST TWICE A DAY 09/05/19   [provider]  Alpha-Lipoic Acid 600 MG CAPS Take by mouth daily.    [provider]  Calcium Citrate (CITRACAL PO) Take by mouth in the morning, at noon, and at bedtime.    [provider]  Chromium Picolinate 1000 MCG TABS Take by mouth. Patient states that she takes 1 by mouth every other day.    [provider]  ibuprofen (ADVIL) 600 MG tablet Take 1 tablet (600 mg total) by mouth every 6 (six) hours as needed. 10/14/20   LChase Picket MD  Magnesium 400 MG CAPS Take 400 mg by mouth daily. Patient states that she takes for cramps.    [provider]  Multiple Vitamin (MULTI VITAMIN DAILY PO) Take by mouth 2 (two) times daily. Advanced MVI  [provider]  NON FORMULARY Terry Naturally Curamed 700 mg daily.    [provider]  ondansetron (ZOFRAN) 4 MG tablet TAKE 1 TABLET BY MOUTH EVERY 8 HOURS AS NEEDED FOR NAUSEA AND VOMITING 12/25/19   Laurine Blazer B, PA-C  OVER THE COUNTER MEDICATION Bone Strengthening - Patient states that she takes 5 per day Included Calcium 1300 mg with  Vitamin D and K.    [provider]  pantoprazole (PROTONIX) 40 MG tablet Take 1 tablet (40 mg total) by mouth daily. 12/18/19 12/17/20  Laurine Blazer B, PA-C  Probiotic Product (DAILY PROBIOTIC PO) Take by mouth. Patient states that this is weight Management Probiotic - Patient takes one by mouth daily.     [provider]  Vitamin Mixture (ESTER-C PO) Take 2,000 mg by mouth daily.    [provider]    Allergies    Alpha-gal and Other  Review of Systems   Review of Systems  Constitutional:  Negative for appetite change and fatigue.  HENT:  Negative for congestion, ear discharge and sinus pressure.   Eyes:  Negative for discharge.  Respiratory:  Negative for cough.   Cardiovascular:  Negative for chest pain.  Gastrointestinal:  Positive for abdominal pain. Negative for diarrhea.       Nauseau,   diarrhea  Genitourinary:  Negative for frequency and hematuria.  Musculoskeletal:  Negative for back pain.  Skin:  Negative for rash.  Neurological:  Negative for seizures and headaches.  Psychiatric/Behavioral:  Negative for hallucinations.    Physical Exam Updated Vital Signs BP 119/69   Pulse 89   Temp 97.8 F (36.6 C) (Oral)   Resp 15   Ht 5' 2"  (1.575 m)   Wt 66.2 kg   SpO2 100%   BMI 26.70 kg/m   Physical Exam Vitals and nursing note reviewed.  Constitutional:      Appearance: She is well-developed.  HENT:     Head: Normocephalic.     Nose: Nose normal.  Eyes:     General: No scleral icterus.    Conjunctiva/sclera: Conjunctivae normal.  Neck:     Thyroid: No thyromegaly.  Cardiovascular:     Rate and Rhythm: Normal rate and regular rhythm.     Heart sounds: No murmur heard.   No friction rub. No gallop.  Pulmonary:     Breath sounds: No stridor. No wheezing or rales.  Chest:     Chest wall: No tenderness.  Abdominal:     General: There is no distension.     Tenderness: There is abdominal tenderness. There is no rebound.  Musculoskeletal:        General: Normal range of motion.     Cervical back: Neck supple.  Lymphadenopathy:     Cervical: No cervical adenopathy.  Skin:    Findings: No erythema or rash.  Neurological:     Mental Status: She is alert and oriented to person, place, and time.     Motor: No abnormal muscle tone.      Coordination: Coordination normal.  Psychiatric:        Behavior: Behavior normal.    ED Results / Procedures / Treatments   Labs (all labs ordered are listed, but only abnormal results are displayed) Labs Reviewed  COMPREHENSIVE METABOLIC PANEL - Abnormal; Notable for the following components:      Result Value   Potassium 2.7 (*)    Glucose, Bld 116 (*)    All other components within normal limits  CBC - Abnormal; Notable for the following components:   WBC 14.3 (*)    RBC 5.36 (*)    Hemoglobin 15.6 (*)    HCT 46.3 (*)    All other components within normal limits  URINALYSIS, ROUTINE W REFLEX MICROSCOPIC - Abnormal; Notable for the following components:   Specific Gravity, Urine <1.005 (*)    All other components within normal limits  LIPASE, BLOOD    EKG None  Radiology CT ABDOMEN PELVIS W CONTRAST  Result Date: 03/06/2021 CLINICAL DATA:  Abdominal pain for 2 weeks EXAM: CT ABDOMEN AND PELVIS WITH CONTRAST TECHNIQUE: Multidetector CT imaging of the abdomen and pelvis was performed using the standard protocol following bolus administration of intravenous contrast. CONTRAST:  160m OMNIPAQUE IOHEXOL 300 MG/ML  SOLN COMPARISON:  Ultrasound from 03/03/2021 FINDINGS: Lower chest: Lung bases demonstrate no acute abnormality. Hepatobiliary: Fatty infiltration of the liver is noted similar to that seen on prior ultrasound. Gallbladder is within normal limits. Pancreas: Unremarkable. No pancreatic ductal dilatation or surrounding inflammatory changes. Spleen: Normal in size without focal abnormality. Adrenals/Urinary Tract: Adrenal glands are within normal limits. Kidneys demonstrate a normal enhancement pattern bilaterally. No obstructive changes are seen. No renal calculi are noted. Ureters are within normal limits. The bladder is well distended. Stomach/Bowel: The appendix is well visualized and within normal limits. Fluid is noted throughout the colon which may be related to a  diarrheal state. No findings to suggest diverticular change or diverticulitis are noted. Stomach is within normal limits. A few mildly prominent fluid-filled loops of small bowel are noted without definitive transition zone. This may represent some mild enteritis. Distal small bowel is unremarkable. Vascular/Lymphatic: Aortic atherosclerosis. No enlarged abdominal or pelvic lymph nodes. Reproductive: Status post hysterectomy. No adnexal masses. Other: No abdominal wall hernia or abnormality. No abdominopelvic ascites. Musculoskeletal: No acute or significant osseous findings. IMPRESSION: Few mildly prominent proximal small bowel loops with fluid within. This may represent some mild enteritis. No obstructive changes are seen. Increased fluid within the colon likely related to a diarrheal state. Fatty liver. Electronically Signed   By: MInez CatalinaM.D.   On: 03/06/2021 22:18    Procedures Procedures   Medications Ordered in ED Medications  potassium chloride 10 mEq in 100 mL IVPB (10 mEq Intravenous New Bag/Given 03/06/21 2221)  ondansetron (ZOFRAN) injection 4 mg (4 mg Intravenous Given 03/06/21 2104)  HYDROmorphone (DILAUDID) injection 0.5 mg (0.5 mg Intravenous Given 03/06/21 2104)  iohexol (OMNIPAQUE) 300 MG/ML solution 100 mL (100 mLs Intravenous Contrast Given 03/06/21 2143)  sodium chloride 0.9 % bolus 1,000 mL (1,000 mLs Intravenous New Bag/Given 03/06/21 2221)  potassium chloride SA (KLOR-CON M) CR tablet 40 mEq (40 mEq Oral Given 03/06/21 2230)    ED Course  I have reviewed the triage vital signs and the nursing notes.  Pertinent labs & imaging results that were available during my care of the patient were reviewed by me and considered in my medical decision making (see chart for details).    MDM Rules/Calculators/A&P                          CT scan negative for diverticulitis.  Possible enteritis.  She is given potassium Zofran and Bentyl and will follow up with GI Final Clinical  Impression(s) / ED Diagnoses Final diagnoses:  Gastroenteritis    Rx / DC Orders ED Discharge Orders          Ordered    ondansetron (  ZOFRAN-ODT) 4 MG disintegrating tablet        03/06/21 2248    dicyclomine (BENTYL) 20 MG tablet        03/06/21 2248    potassium chloride SA (KLOR-CON M) 20 MEQ tablet  Daily        03/06/21 2248             Milton Ferguson, MD 03/06/21 2252

## 2021-03-06 NOTE — ED Triage Notes (Signed)
Pt reports abd pain x 2 weeks. Went to PCP last week and prescribed abx for possible diverticulitis. Pain worsened last night associated with nausea and diarrhea.

## 2021-03-10 ENCOUNTER — Ambulatory Visit (HOSPITAL_COMMUNITY)
Admission: RE | Admit: 2021-03-10 | Discharge: 2021-03-10 | Disposition: A | Discharge status: Home / Self Care | Payer: Medicare Other | Source: Ambulatory Visit | Attending: Family Medicine | Admitting: Family Medicine

## 2021-03-10 ENCOUNTER — Telehealth (INDEPENDENT_AMBULATORY_CARE_PROVIDER_SITE_OTHER): Payer: Self-pay | Admitting: Gastroenterology

## 2021-03-10 ENCOUNTER — Other Ambulatory Visit: Payer: Self-pay

## 2021-03-10 DIAGNOSIS — E2839 Other primary ovarian failure: Secondary | ICD-10-CM | POA: Insufficient documentation

## 2021-03-10 DIAGNOSIS — Z1231 Encounter for screening mammogram for malignant neoplasm of breast: Secondary | ICD-10-CM | POA: Insufficient documentation

## 2021-03-10 DIAGNOSIS — Z13820 Encounter for screening for osteoporosis: Secondary | ICD-10-CM | POA: Diagnosis not present

## 2021-03-10 DIAGNOSIS — M85852 Other specified disorders of bone density and structure, left thigh: Secondary | ICD-10-CM | POA: Diagnosis not present

## 2021-03-10 DIAGNOSIS — Z780 Asymptomatic menopausal state: Secondary | ICD-10-CM | POA: Diagnosis not present

## 2021-03-10 DIAGNOSIS — K900 Celiac disease: Secondary | ICD-10-CM | POA: Insufficient documentation

## 2021-03-10 NOTE — Telephone Encounter (Signed)
If still having diarrhea she can continue taking it but if not she can stop

## 2021-03-10 NOTE — Telephone Encounter (Signed)
Patient came into the office to make a follow up appointment - patient was seen at [facility] on 12/3 - has a virtual appointment with Vikki Ports on 12/13 - patient would like to know if she needs to continue taking imodium - please advise - ph# [phone]

## 2021-03-10 NOTE — Telephone Encounter (Signed)
I called and left a message asked that patient please return call to the office.

## 2021-03-12 NOTE — Telephone Encounter (Signed)
Tried calling patient mail box full unable to leave a message.

## 2021-03-15 NOTE — Telephone Encounter (Signed)
Tried calling patient again and vm is still full.

## 2021-03-15 NOTE — Telephone Encounter (Signed)
Thanks, will follow with [facility] in clinic

## 2021-03-16 ENCOUNTER — Other Ambulatory Visit: Payer: Self-pay

## 2021-03-16 ENCOUNTER — Encounter (INDEPENDENT_AMBULATORY_CARE_PROVIDER_SITE_OTHER): Payer: Self-pay | Admitting: Gastroenterology

## 2021-03-16 ENCOUNTER — Ambulatory Visit (INDEPENDENT_AMBULATORY_CARE_PROVIDER_SITE_OTHER): Payer: Medicare Other | Admitting: Gastroenterology

## 2021-03-16 VITALS — Ht 62.5 in | Wt 150.0 lb

## 2021-03-16 DIAGNOSIS — A084 Viral intestinal infection, unspecified: Secondary | ICD-10-CM

## 2021-03-16 NOTE — Progress Notes (Signed)
Primary Care Physician:  Sharilyn Sites, MD  Primary GI: [facility]  Patient Location: Home   Provider Location: [facility] office   Reason for Visit: f/u gastroenteritis   Persons present on the virtual encounter, with roles: [place], Provider   Total time (minutes) spent on medical discussion: 20 minutes   Visit was conducted using virtual method.  Visit was requested by patient.  Virtual Visit via telephone  I connected with Rhonda Sullivan on 03/16/21 at  3:30 PM EST by telephone and verified that I am speaking with the correct person using two identifiers.   I discussed the limitations, risks, security and privacy concerns of performing an evaluation and management service by telephone and the availability of in person appointments. I also discussed with the patient that there may be a patient responsible charge related to this service. The patient expressed understanding and agreed to proceed.  Chief Complaint  Patient presents with   Hospitalization Follow-up    Patient being seen today due to recent hospitalization on 03/06/21 for Gastroenteritis. Patient states she had had severe diarrhea and was having a bm up to 25 times per day. Has resolved since taking imodium. Prior to the hospitalization she had started a diet to help lose weight to prevent DM. Still having some bloating and nausea.   History of Present Illness:  Patient with history of Gout and angio edema who presents for follow up after ED visit on 12/3 for nausea, vomiting and diarrhea. CT A/P with contrast with findings of mildly prominent proximal small loops with fluid within, suspicious for mild enteritis. No obstructive changes.   Patient reports that about 1 month prior to her ED visit, she started a new diet to help with her blood sugars ( 1 protein shake + 1 cup of veggies for all 3 meals of the day), about 2 weeks prior to ED visit she started having some abdominal  discomfort, nausea, bloating and diarrhea, she did not have any fevers or chills.. She has had no episodes of blood in her stools, recent sick contacts, travel or antibiotics.  She reports that she was having severe, watery diarrhea, multiple episodes per day (one day was 25 BMs). She went to PCP who did [place] and stool studies that were negative. PCP referred her to the ED to have a CT scan for further evaluation. CT in ED revealed suspected mild enteritis, patient was provided supportive care and discharged from ED. She reports that she continued to have frequent, watery, diarrhea, abdominal pain, and nausea after discharge from hospital. She was still having multiple episodes of watery diarrhea, on the morning of 12/6, she decided to start taking imodium, she took 2 imodium  that morning and states that diarrhea basically stopped after that, she then took 1 more imodium that evening and states symptoms were well controlled. She has been doing this regimen up until today, when she took only 1 imodium this morning. She is still having some bloating, nausea and abdominal cramping, however, symptoms have improved dramatically from what they were initially. she reports that she had about 4 BMs today but they are more soft and loose, not watery. She reports that appetite has not been good up until the past few days, today she has been pretty hungry, however, she is nervous about eating as she does not want to make her diarrhea worse again. She denied any recent travel, antibiotics, sick contacts or medication changes prior to start of  diarrhea. She does endorse she was under a good amount of stress dealing with some family stuff, just prior to her symptoms beginning.   Last EGD:12/06/18 Last Colonoscopy:2016  Past Medical History:  Diagnosis Date   Angio-edema    GERD (gastroesophageal reflux disease)     Past Surgical History:  Procedure Laterality Date   ABDOMINAL HYSTERECTOMY     BIOPSY  12/06/2018    Procedure: BIOPSY;  Surgeon: Rogene Houston, MD;  [facility];  Service: Endoscopy;;  Duodenal Biopsies fopr celiac disease    ESOPHAGOGASTRODUODENOSCOPY N/A 12/06/2018   Procedure: ESOPHAGOGASTRODUODENOSCOPY (EGD);  Surgeon: Rogene Houston, MD;  [facility]: [facility];  Service: Endoscopy;  Laterality: N/A;  3:00   POLYPECTOMY  12/06/2018   Procedure: POLYPECTOMY;  Surgeon: Rogene Houston, MD;  [facility];  Service: Endoscopy;;  gastric polyps biopsy      Current Meds  Medication Sig   ACCU-CHEK GUIDE test strip USE TO TEST TWICE A DAY   ibuprofen (ADVIL) 600 MG tablet Take 1 tablet (600 mg total) by mouth every 6 (six) hours as needed.   ondansetron (ZOFRAN) 4 MG tablet TAKE 1 TABLET BY MOUTH EVERY 8 HOURS AS NEEDED FOR NAUSEA AND VOMITING   ondansetron (ZOFRAN-ODT) 4 MG disintegrating tablet 57m ODT q4 hours prn nausea/vomit   pantoprazole (PROTONIX) 40 MG tablet Take 1 tablet (40 mg total) by mouth daily. (Patient taking differently: Take 40 mg by mouth 2 (two) times daily.)     Family History  Problem Relation Age of Onset   Allergic rhinitis Mother    Asthma Neg Hx    Eczema Neg Hx    Urticaria Neg Hx     Social History   Socioeconomic History   Marital status: Married    Spouse name: Not on file   Number of children: Not on file   Years of education: Not on file   Highest education level: Not on file  Occupational History   Not on file  Tobacco Use   Smoking status: Never   Smokeless tobacco: Never  Vaping Use   Vaping Use: Never used  Substance and Sexual Activity   Alcohol use: No   Drug use: Never   Sexual activity: Not on file  Other Topics Concern   Not on file  Social History Narrative   Not on file   Social Determinants of Health   Financial Resource Strain: Not on file  Food Insecurity: Not on file  Transportation Needs: Not on file  Physical Activity: Not on file  Stress: Not on file  Social Connections: Not on file     Review of Systems: Gen: Denies fever, chills, anorexia. Denies fatigue, weakness, weight loss.  CV: Denies chest pain, palpitations, syncope, peripheral edema, and claudication. Resp: Denies dyspnea at rest, cough, wheezing, coughing up blood, and pleurisy. GI: see HPI Derm: Denies rash, itching, dry skin Psych: Denies depression, anxiety, memory loss, confusion. No homicidal or suicidal ideation.  Heme: Denies bruising, bleeding, and enlarged lymph nodes.  Observations/Objective: No distress. Unable to perform physical exam due to telephone encounter. No video available.   Assessment and Plan: Acute diarrheal illness likely viral gastroenteritis, reassuringly symptoms seem to be improving, diarrhea has slowed down and appetite is returning. Patient was encouraged to continue staying well hydrated and eating the BRAT diet until diarrhea has completely resolved. She can continue with imodium as needed, do not exceed 4 tablets/24 hour period. She will call me with an  update in about 1 week to see how symptoms are doing. She will make me aware of any alarm symptoms she develops.  -continue to use imodium as needed, do not exceed 4 tablets in a 24 hour period. -Continue to stay well hydrated, can alternate water and a low or no sugar electrolyte replacement such as gatorade.  -can try doing the BRAT diet as well, this is bland, easy to digest foods that are usually well tolerated during times of GI upset.  Please let me know if you develop worsening diarrhea, vomiting, fevers, or rectal bleeding, otherwise, I would like for you to give me a call in about a week with an update on how your symptoms are doing.  Follow Up Instructions:  I discussed the assessment and treatment plan with the patient. The patient was provided an opportunity to ask questions and all were answered. The patient agreed with the plan and demonstrated an understanding of the instructions.   The patient was advised to  call back or seek an in-person evaluation if the symptoms worsen or if the condition fails to improve as anticipated.  I provided 20 minutes of face-to-face time during this MyChart Video encounter.

## 2021-03-16 NOTE — Patient Instructions (Signed)
You can continue to use imodium as needed, do not exceed 4 tablets in a 24 hour period. Continue to stay well hydrated, you can alternate water and an low or no sugar electrolyte replacement such as gatorade.  You can try doing the BRAT diet as well, this is bland, easy to digest foods that are usually well tolerated during times of GI upset.  Please let me know if you develop worsening diarrhea, vomiting, fevers, or rectal bleeding, otherwise, I would like for you to give me a call in about a week with an update on how your symptoms are doing.

## 2021-09-10 DIAGNOSIS — K529 Noninfective gastroenteritis and colitis, unspecified: Secondary | ICD-10-CM | POA: Diagnosis not present

## 2021-09-10 DIAGNOSIS — R739 Hyperglycemia, unspecified: Secondary | ICD-10-CM | POA: Diagnosis not present

## 2021-09-10 DIAGNOSIS — Z91014 Allergy to mammalian meats: Secondary | ICD-10-CM | POA: Diagnosis not present

## 2021-09-10 DIAGNOSIS — F330 Major depressive disorder, recurrent, mild: Secondary | ICD-10-CM | POA: Diagnosis not present

## 2021-11-01 DIAGNOSIS — Z91014 Allergy to mammalian meats: Secondary | ICD-10-CM | POA: Diagnosis not present

## 2021-11-01 DIAGNOSIS — L500 Allergic urticaria: Secondary | ICD-10-CM | POA: Diagnosis not present

## 2021-11-01 DIAGNOSIS — R053 Chronic cough: Secondary | ICD-10-CM | POA: Diagnosis not present

## 2021-11-19 DIAGNOSIS — Z91018 Allergy to other foods: Secondary | ICD-10-CM | POA: Diagnosis not present

## 2021-11-19 DIAGNOSIS — R053 Chronic cough: Secondary | ICD-10-CM | POA: Diagnosis not present

## 2021-11-19 DIAGNOSIS — Z91014 Allergy to mammalian meats: Secondary | ICD-10-CM | POA: Diagnosis not present

## 2021-11-19 DIAGNOSIS — J3089 Other allergic rhinitis: Secondary | ICD-10-CM | POA: Diagnosis not present

## 2022-01-10 DIAGNOSIS — X32XXXD Exposure to sunlight, subsequent encounter: Secondary | ICD-10-CM | POA: Diagnosis not present

## 2022-01-10 DIAGNOSIS — L570 Actinic keratosis: Secondary | ICD-10-CM | POA: Diagnosis not present

## 2022-01-24 ENCOUNTER — Encounter (INDEPENDENT_AMBULATORY_CARE_PROVIDER_SITE_OTHER): Payer: Self-pay

## 2022-01-24 ENCOUNTER — Encounter (INDEPENDENT_AMBULATORY_CARE_PROVIDER_SITE_OTHER): Payer: Self-pay | Admitting: Gastroenterology

## 2022-01-24 ENCOUNTER — Ambulatory Visit (INDEPENDENT_AMBULATORY_CARE_PROVIDER_SITE_OTHER): Payer: PPO | Admitting: Gastroenterology

## 2022-01-24 VITALS — BP 124/78 | HR 67 | Temp 97.9°F | Ht 62.5 in | Wt 160.8 lb

## 2022-01-24 DIAGNOSIS — R110 Nausea: Secondary | ICD-10-CM

## 2022-01-24 DIAGNOSIS — Z91018 Allergy to other foods: Secondary | ICD-10-CM

## 2022-01-24 DIAGNOSIS — R1033 Periumbilical pain: Secondary | ICD-10-CM | POA: Diagnosis not present

## 2022-01-24 DIAGNOSIS — R143 Flatulence: Secondary | ICD-10-CM | POA: Diagnosis not present

## 2022-01-24 DIAGNOSIS — K900 Celiac disease: Secondary | ICD-10-CM | POA: Diagnosis not present

## 2022-01-24 NOTE — H&P (View-Only) (Signed)
Referring Provider: Sharilyn Sites, MD Primary Care Physician:  Sharilyn Sites, MD Primary GI Physician: Jenetta Downer   Chief Complaint  Patient presents with   Celiac Disease    Patient started having vomiting and diarrhea back in 2020. Started losing weight. Found out she had celiac. Changed diet but still was getting sick. Then found out she had alpha gal. Still having some spells with gas and uncontrolled bowels. Tried imodium. Stopped pantoprazole after finding out about side effects.    HPI:   Rhonda Sullivan is a 66 y.o. female with past medical history of gout, angio edema, celiac disease, alpha gal   Patient presenting today for diarrhea and gas.  Last seen in December 2022, virtually. At that time, had recent ED visit for n/v/d. CT A/P with contrast during that time with findings of mildly prominent proximal small loops with fluid within, suspicious for mild enteritis. She continued to have watery diarrhea, abdominal pain and nausea, taking imodium which was helping some. Felt that symptoms were gradually improving. BMs the day of her visit had transitioned more to soft and loose but no longer watery.   Recommended she continue to use imodium PRN, stay well hydrated, use BRAT diet and make me aware if GI symptoms did not improve.   Present: Patient reports that in 2020 she was diagnosed with celiac disease, has made multiple changes to her diet and continues to have intermittent mushy stools, gas, nausea and abdominal pain. She was diagnosed with alpha gal in 2021. Has tried avoiding all red meat after that as well. Previously avoiding dairy but no improvement with this, does not feel that dairy products worsen her symptoms. Notes going back and forth on being completely gluten free and consuming gluten products. Does eat out some, unsure of possible cross contamination.  She states that she stopped pantoprazole a few months back due to concern of side effects and noted improvement in  diarrhea for about 1 month but then symptoms returned. stools were very dark a few months ago but denies actual melena or BRB in stools, now more normal color brown. She goes through bouts of diarrhea, nausea (no vomiting), abdominal pain, having some issues with insomnia as well as feeling "weak." When she is having diarrhea, this will occur off and on all day long, stools are very mushy with a bad odor. Not taking imodium anymore as this caused constipation.  Has a lot of gas and flatulence after eating with some worsening mid abdominal pain. She notes some early satiety but denies weight loss.   She is having more heartburn and pain with swallowing and dysphagia with food sticking in mid chest since stopping PPI, she does note that she can drink a lot of water and food will eventually go down. Previously took omeprazole but had to switch to pantoprazole when she got diagnosed with alpha gal. She is doing alkaline water, tums, milk and a natural supplement for heartburn.  Using tums 2-3x/day some days. Notes frequent waking in the night due to acid regurgitation.    Had labs over the past few months with PCP, though unsure what labs were drawn then.   Last Colonoscopy:2016 internal hemorrhoids, otherwise normal exam Last Endoscopy: 12/2018- Normal esophagus. - LA Grade A reflux esophagitis.Single erosion at [facility]. - A few gastric polyps. Three biopsied(celiac disease). - Normal duodenal bulb and second portion of the duodenum. Biopsied.   Past Medical History:  Diagnosis Date   Angio-edema    GERD (gastroesophageal reflux  disease)     Past Surgical History:  Procedure Laterality Date   ABDOMINAL HYSTERECTOMY     BIOPSY  12/06/2018   Procedure: BIOPSY;  Surgeon: Rogene Houston, MD;  [facility];  Service: Endoscopy;;  Duodenal Biopsies fopr celiac disease    ESOPHAGOGASTRODUODENOSCOPY N/A 12/06/2018   Procedure: ESOPHAGOGASTRODUODENOSCOPY (EGD);  Surgeon: Rogene Houston, MD;   [facility];  Service: Endoscopy;  Laterality: N/A;  3:00   POLYPECTOMY  12/06/2018   Procedure: POLYPECTOMY;  Surgeon: Rogene Houston, MD;  [facility];  Service: Endoscopy;;  gastric polyps biopsy     Current Outpatient Medications  Medication Sig Dispense Refill   ACCU-CHEK GUIDE test strip USE TO TEST TWICE A DAY     OVER THE COUNTER MEDICATION Probiotic - New chapter one daily  Alpha lipoid acid 684m + R-Lipoic acid  Chromium picolinate 1000 mcg  Curcumin - Super Absorpiton 6020m  B-12 spray 500 mcg daily  Vit C 2,000 mg  Magnesium 20082mMulti - Vit - New Chapter Women's Advanced 2 tabs daily  Plant Calcium Bone Strength 3 tabs daily  Reflux Health for Hearburn and Acid discomfort 3 tabs daily (Mucosave FG, Aloe Vera, DGL, raw whole food proprietary blend)     Multiple Vitamin (MULTI VITAMIN DAILY PO) Take by mouth 2 (two) times daily. Advanced MVI (Patient not taking: Reported on 03/16/2021)     NON FORMULARY Terry Naturally Curamed 700 mg daily. (Patient not taking: Reported on 03/16/2021)     pantoprazole (PROTONIX) 40 MG tablet Take 1 tablet (40 mg total) by mouth daily. (Patient taking differently: Take 40 mg by mouth 2 (two) times daily.) 90 tablet 3   No current facility-administered medications for this visit.    Allergies as of 01/24/2022 - Review Complete 01/24/2022  Allergen Reaction Noted   Alpha-gal Other (See Comments) 12/18/2019   Other Other (See Comments) 02/28/2016    Family History  Problem Relation Age of Onset   Allergic rhinitis Mother    Asthma Neg Hx    Eczema Neg Hx    Urticaria Neg Hx     Social History   Socioeconomic History   Marital status: Married    Spouse name: Not on file   Number of children: Not on file   Years of education: Not on file   Highest education level: Not on file  Occupational History   Not on file  Tobacco Use   Smoking status: Never   Smokeless tobacco: Never  Vaping Use    Vaping Use: Never used  Substance and Sexual Activity   Alcohol use: No   Drug use: Never   Sexual activity: Not on file  Other Topics Concern   Not on file  Social History Narrative   Not on file   Social Determinants of Health   Financial Resource Strain: Not on file  Food Insecurity: Not on file  Transportation Needs: Not on file  Physical Activity: Not on file  Stress: Not on file  Social Connections: Not on file   Review of systems General: negative for malaise, night sweats, fever, chills, weight loss Neck: Negative for lumps, goiter, pain and significant neck swelling Resp: Negative for cough, wheezing, dyspnea at rest CV: Negative for chest pain, leg swelling, palpitations, orthopnea GI: denies melena, hematochezia, vomiting,constipation, odyonophagia, or unintentional weight loss. +nausea +abdominal pain +gas/belching +mushy stools +early satiety +dysphagia +GERD MSK: Negative for joint pain or swelling, back pain, and muscle  pain. Derm: Negative for itching or rash Psych: Denies depression, anxiety, memory loss, confusion. No homicidal or suicidal ideation.  Heme: Negative for prolonged bleeding, bruising easily, and swollen nodes. Endocrine: Negative for cold or heat intolerance, polyuria, polydipsia and goiter. Neuro: negative for tremor, gait imbalance, syncope and seizures. The remainder of the review of systems is noncontributory.  Physical Exam: There were no vitals taken for this visit. General:   Alert and oriented. No distress noted. Pleasant and cooperative.  Head:  Normocephalic and atraumatic. Eyes:  Conjuctiva clear without scleral icterus. Mouth:  Oral mucosa pink and moist. Good dentition. No lesions. Heart: Normal rate and rhythm, s1 and s2 heart sounds present.  Lungs: Clear lung sounds in all lobes. Respirations equal and unlabored. Abdomen:  +BS, soft, and non-distended. Mild TTP of mid abdomen, around umbilicus. No rebound or guarding. No HSM or  masses noted. Derm: No palmar erythema or jaundice Msk:  Symmetrical without gross deformities. Normal posture. Extremities:  Without edema. Neurologic:  Alert and  oriented x4 Psych:  Alert and cooperative. Normal mood and affect.  Invalid input(s): "6 MONTHS"   ASSESSMENT: Rhonda Sullivan is a 66 y.o. female presenting today with history of celiac disease and alpha gal allergy with continued abdominal pain, nausea, gas and soft stools.   History of celiac disease, diagnosed in 2020, she has been intermittent gluten free though never really sustains this for more than a few weeks or month due to feeling that symptoms do not improve. Also diagnosed with alpha gal in 2021 with continued positive testing of this. She does avoid all mammal meat. She eats out some so cross contamination is certainly a possibility. She continues to have nausea, mid abdominal pain, flatulence, belching and mushy stools. No melena or BRB. No vomiting or weight loss. I will refer her to dietician as she needs to follow a strict gluten free and meat free diet and could use some guidance with this.   Also having some dysphagia with foods sticking in mid chest and early satiety. She stopped PPI due to concern for side effects. Trying to treat GERD naturally though also using tums 2-3x/day. She was cautioned on frequent use of tums and advised to avoid these. Patient was re-assured regarding PPI use and safety of when appropriate indications in question. Most recent studies on PPI therapy that association of symptoms is not equivalent to causation and overall association with for example osteoporosis is weak and based on observational studies. When PPI use is indicated, it is safe to proceed with therapy and titrate dosing/use based on symptom response. Despite recommendations, she is adamant she does not wish to go back on PPI at this time. Should implement strict reflux precautions. Recommend proceeding with EGD +/- dilation for  further evaluation, while celiac disease and/or alpha gal are likely playing a role in her symptoms, cannot rule out PUD, gastritis, and in regards to dysphagia this may be from uncontrolled GERD, however, cannot rule out esophageal dilation, stricture, ring, web or stenosis.  Indications, risks and benefits of procedure discussed in detail with patient. Patient verbalized understanding and is in agreement to proceed with EGD at this time.   She is noting fatigue and weakness at times as well, considering Celiac disease, there is potential for IDA. She had labs done a few months ago with PCP, she will let me know which labs were drawn at this time, if no hemoglobin, will check CBC to rule out underlying anemia contributing to her  weakness.   PLAN:  Dietician referral  2. Schedule EGD, ASA II, ENDO 1 3. Avoid tums, strict reflux precautions and chew precautions 4. Avoid gluten, mammal meat, be mindful of cross contamination 5. Will recheck CBC if no recent hemoglobin checked 6. Should ideally consider low dose PPI or H2B, pt declined at this time  All questions were answered, patient verbalized understanding and is in agreement with plan as outlined above.    Follow Up: 3 months   Aamilah Augenstein L. Alver Sorrow, MSN, APRN, AGNP-C Adult-Gerontology Nurse Practitioner [facility]  I have reviewed the note and agree with the APP's assessment as described in this progress note  Will recheck celiac disease panel , iron studies and Vitamin D levels as well. Agree with dietitian referral to make sure diet is controlled.   Maylon Peppers, MD Gastroenterology and Hepatology [facility] Gastroenterology

## 2022-01-24 NOTE — Progress Notes (Addendum)
Referring Provider: Sharilyn Sites, MD Primary Care Physician:  Sharilyn Sites, MD Primary GI Physician: Jenetta Downer   Chief Complaint  Patient presents with   Celiac Disease    Patient started having vomiting and diarrhea back in 2020. Started losing weight. Found out she had celiac. Changed diet but still was getting sick. Then found out she had alpha gal. Still having some spells with gas and uncontrolled bowels. Tried imodium. Stopped pantoprazole after finding out about side effects.    HPI:   Rhonda Sullivan is a 66 y.o. female with past medical history of gout, angio edema, celiac disease, alpha gal   Patient presenting today for diarrhea and gas.  Last seen in December 2022, virtually. At that time, had recent ED visit for n/v/d. CT A/P with contrast during that time with findings of mildly prominent proximal small loops with fluid within, suspicious for mild enteritis. She continued to have watery diarrhea, abdominal pain and nausea, taking imodium which was helping some. Felt that symptoms were gradually improving. BMs the day of her visit had transitioned more to soft and loose but no longer watery.   Recommended she continue to use imodium PRN, stay well hydrated, use BRAT diet and make me aware if GI symptoms did not improve.   Present: Patient reports that in 2020 she was diagnosed with celiac disease, has made multiple changes to her diet and continues to have intermittent mushy stools, gas, nausea and abdominal pain. She was diagnosed with alpha gal in 2021. Has tried avoiding all red meat after that as well. Previously avoiding dairy but no improvement with this, does not feel that dairy products worsen her symptoms. Notes going back and forth on being completely gluten free and consuming gluten products. Does eat out some, unsure of possible cross contamination.  She states that she stopped pantoprazole a few months back due to concern of side effects and noted improvement in  diarrhea for about 1 month but then symptoms returned. stools were very dark a few months ago but denies actual melena or BRB in stools, now more normal color brown. She goes through bouts of diarrhea, nausea (no vomiting), abdominal pain, having some issues with insomnia as well as feeling "weak." When she is having diarrhea, this will occur off and on all day long, stools are very mushy with a bad odor. Not taking imodium anymore as this caused constipation.  Has a lot of gas and flatulence after eating with some worsening mid abdominal pain. She notes some early satiety but denies weight loss.   She is having more heartburn and pain with swallowing and dysphagia with food sticking in mid chest since stopping PPI, she does note that she can drink a lot of water and food will eventually go down. Previously took omeprazole but had to switch to pantoprazole when she got diagnosed with alpha gal. She is doing alkaline water, tums, milk and a natural supplement for heartburn.  Using tums 2-3x/day some days. Notes frequent waking in the night due to acid regurgitation.    Had labs over the past few months with PCP, though unsure what labs were drawn then.   Last Colonoscopy:2016 internal hemorrhoids, otherwise normal exam Last Endoscopy: 12/2018- Normal esophagus. - LA Grade A reflux esophagitis.Single erosion at [facility]. - A few gastric polyps. Three biopsied(celiac disease). - Normal duodenal bulb and second portion of the duodenum. Biopsied.   Past Medical History:  Diagnosis Date   Angio-edema    GERD (gastroesophageal reflux  disease)     Past Surgical History:  Procedure Laterality Date   ABDOMINAL HYSTERECTOMY     BIOPSY  12/06/2018   Procedure: BIOPSY;  Surgeon: Rogene Houston, MD;  [facility];  Service: Endoscopy;;  Duodenal Biopsies fopr celiac disease    ESOPHAGOGASTRODUODENOSCOPY N/A 12/06/2018   Procedure: ESOPHAGOGASTRODUODENOSCOPY (EGD);  Surgeon: Rogene Houston, MD;   [facility];  Service: Endoscopy;  Laterality: N/A;  3:00   POLYPECTOMY  12/06/2018   Procedure: POLYPECTOMY;  Surgeon: Rogene Houston, MD;  [facility];  Service: Endoscopy;;  gastric polyps biopsy     Current Outpatient Medications  Medication Sig Dispense Refill   ACCU-CHEK GUIDE test strip USE TO TEST TWICE A DAY     OVER THE COUNTER MEDICATION Probiotic - New chapter one daily  Alpha lipoid acid 617m + R-Lipoic acid  Chromium picolinate 1000 mcg  Curcumin - Super Absorpiton 6045m  B-12 spray 500 mcg daily  Vit C 2,000 mg  Magnesium 20037mMulti - Vit - [place] Chapter Women's Advanced 2 tabs daily  Plant Calcium Bone Strength 3 tabs daily  Reflux Health for Hearburn and Acid discomfort 3 tabs daily (Mucosave FG, Aloe Vera, DGL, raw whole food proprietary blend)     Multiple Vitamin (MULTI VITAMIN DAILY PO) Take by mouth 2 (two) times daily. Advanced MVI (Patient not taking: Reported on 03/16/2021)     NON FORMULARY Terry Naturally Curamed 700 mg daily. (Patient not taking: Reported on 03/16/2021)     pantoprazole (PROTONIX) 40 MG tablet Take 1 tablet (40 mg total) by mouth daily. (Patient taking differently: Take 40 mg by mouth 2 (two) times daily.) 90 tablet 3   No current facility-administered medications for this visit.    Allergies as of 01/24/2022 - Review Complete 01/24/2022  Allergen Reaction Noted   Alpha-gal Other (See Comments) 12/18/2019   Other Other (See Comments) 02/28/2016    Family History  Problem Relation Age of Onset   Allergic rhinitis Mother    Asthma Neg Hx    Eczema Neg Hx    Urticaria Neg Hx     Social History   Socioeconomic History   Marital status: Married    Spouse name: Not on file   Number of children: Not on file   Years of education: Not on file   Highest education level: Not on file  Occupational History   Not on file  Tobacco Use   Smoking status: Never   Smokeless tobacco: Never  Vaping Use    Vaping Use: Never used  Substance and Sexual Activity   Alcohol use: No   Drug use: Never   Sexual activity: Not on file  Other Topics Concern   Not on file  Social History Narrative   Not on file   Social Determinants of Health   Financial Resource Strain: Not on file  Food Insecurity: Not on file  Transportation Needs: Not on file  Physical Activity: Not on file  Stress: Not on file  Social Connections: Not on file   Review of systems General: negative for malaise, night sweats, fever, chills, weight loss Neck: Negative for lumps, goiter, pain and significant neck swelling Resp: Negative for cough, wheezing, dyspnea at rest CV: Negative for chest pain, leg swelling, palpitations, orthopnea GI: denies melena, hematochezia, vomiting,constipation, odyonophagia, or unintentional weight loss. +nausea +abdominal pain +gas/belching +mushy stools +early satiety +dysphagia +GERD MSK: Negative for joint pain or swelling, back pain, and muscle  pain. Derm: Negative for itching or rash Psych: Denies depression, anxiety, memory loss, confusion. No homicidal or suicidal ideation.  Heme: Negative for prolonged bleeding, bruising easily, and swollen nodes. Endocrine: Negative for cold or heat intolerance, polyuria, polydipsia and goiter. Neuro: negative for tremor, gait imbalance, syncope and seizures. The remainder of the review of systems is noncontributory.  Physical Exam: There were no vitals taken for this visit. General:   Alert and oriented. No distress noted. Pleasant and cooperative.  Head:  Normocephalic and atraumatic. Eyes:  Conjuctiva clear without scleral icterus. Mouth:  Oral mucosa pink and moist. Good dentition. No lesions. Heart: Normal rate and rhythm, s1 and s2 heart sounds present.  Lungs: Clear lung sounds in all lobes. Respirations equal and unlabored. Abdomen:  +BS, soft, and non-distended. Mild TTP of mid abdomen, around umbilicus. No rebound or guarding. No HSM or  masses noted. Derm: No palmar erythema or jaundice Msk:  Symmetrical without gross deformities. Normal posture. Extremities:  Without edema. Neurologic:  Alert and  oriented x4 Psych:  Alert and cooperative. Normal mood and affect.  Invalid input(s): "6 MONTHS"   ASSESSMENT: Rhonda Sullivan is a 67 y.o. female presenting today with history of celiac disease and alpha gal allergy with continued abdominal pain, nausea, gas and soft stools.   History of celiac disease, diagnosed in 2020, she has been intermittent gluten free though never really sustains this for more than a few weeks or month due to feeling that symptoms do not improve. Also diagnosed with alpha gal in 2021 with continued positive testing of this. She does avoid all mammal meat. She eats out some so cross contamination is certainly a possibility. She continues to have nausea, mid abdominal pain, flatulence, belching and mushy stools. No melena or BRB. No vomiting or weight loss. I will refer her to dietician as she needs to follow a strict gluten free and meat free diet and could use some guidance with this.   Also having some dysphagia with foods sticking in mid chest and early satiety. She stopped PPI due to concern for side effects. Trying to treat GERD naturally though also using tums 2-3x/day. She was cautioned on frequent use of tums and advised to avoid these. Patient was re-assured regarding PPI use and safety of when appropriate indications in question. Most recent studies on PPI therapy that association of symptoms is not equivalent to causation and overall association with for example osteoporosis is weak and based on observational studies. When PPI use is indicated, it is safe to proceed with therapy and titrate dosing/use based on symptom response. Despite recommendations, she is adamant she does not wish to go back on PPI at this time. Should implement strict reflux precautions. Recommend proceeding with EGD +/- dilation for  further evaluation, while celiac disease and/or alpha gal are likely playing a role in her symptoms, cannot rule out PUD, gastritis, and in regards to dysphagia this may be from uncontrolled GERD, however, cannot rule out esophageal dilation, stricture, ring, web or stenosis.  Indications, risks and benefits of procedure discussed in detail with patient. Patient verbalized understanding and is in agreement to proceed with EGD at this time.   She is noting fatigue and weakness at times as well, considering Celiac disease, there is potential for IDA. She had labs done a few months ago with PCP, she will let me know which labs were drawn at this time, if no hemoglobin, will check CBC to rule out underlying anemia contributing to her  weakness.   PLAN:  Dietician referral  2. Schedule EGD, ASA II, ENDO 1 3. Avoid tums, strict reflux precautions and chew precautions 4. Avoid gluten, mammal meat, be mindful of cross contamination 5. Will recheck CBC if no recent hemoglobin checked 6. Should ideally consider low dose PPI or H2B, pt declined at this time  All questions were answered, patient verbalized understanding and is in agreement with plan as outlined above.    Follow Up: 3 months   Adnan Vanvoorhis L. Alver Sorrow, MSN, APRN, AGNP-C Adult-Gerontology Nurse Practitioner [facility]  I have reviewed the note and agree with the APP's assessment as described in this progress note  Will recheck celiac disease panel , iron studies and Vitamin D levels as well. Agree with dietitian referral to make sure diet is controlled.   Maylon Peppers, MD Gastroenterology and Hepatology [facility] Gastroenterology

## 2022-01-24 NOTE — Patient Instructions (Signed)
Please let me know once you have had a chance to look at your most recent labs, if blood counts (hemoglobin) was not checked, we will check this as you can have anemia secondary to celiac disease I will send referral to dietician to get some assistance with better managing your diet We will schedule you for EGD for further evaluation of your symptoms Please avoid tums, as frequent use of these can increase your calcium to unsafe levels Continue to avoid meat and gluten as much as possible, be mindful of cross contamination especially when eating out I would recommend medical treatment of your acid reflux symptoms with either a low dose PPI or H2B such as famotidine, however, if you do not wish to try these, make sure you are atleast avoiding greasy, spicy, tomato based foods, caffeine, chocolate. Staying upright 2-3 hours after eating and can try elevating head of bed to avoid worse symptoms at night  Follow up 3 months

## 2022-01-25 ENCOUNTER — Telehealth (INDEPENDENT_AMBULATORY_CARE_PROVIDER_SITE_OTHER): Payer: Self-pay

## 2022-01-25 ENCOUNTER — Other Ambulatory Visit (INDEPENDENT_AMBULATORY_CARE_PROVIDER_SITE_OTHER): Payer: Self-pay | Admitting: Gastroenterology

## 2022-01-25 DIAGNOSIS — K900 Celiac disease: Secondary | ICD-10-CM

## 2022-01-25 NOTE — Telephone Encounter (Signed)
Patient made aware per Scherrie Gerlach patient needs Vit D 25 hydroxy,celiac, and Fe studies at [facility]. Patient aware.

## 2022-01-27 DIAGNOSIS — K900 Celiac disease: Secondary | ICD-10-CM | POA: Diagnosis not present

## 2022-01-29 LAB — IRON,TIBC AND FERRITIN PANEL
Ferritin: 114 ng/mL (ref 15–150)
Iron Saturation: 27 % (ref 15–55)
Iron: 104 ug/dL (ref 27–139)
Total Iron Binding Capacity: 387 ug/dL (ref 250–450)
UIBC: 283 ug/dL (ref 118–369)

## 2022-01-29 LAB — VITAMIN D 25 HYDROXY (VIT D DEFICIENCY, FRACTURES): Vit D, 25-Hydroxy: 28.6 ng/mL — ABNORMAL LOW (ref 30.0–100.0)

## 2022-01-29 LAB — CELIAC DISEASE PANEL
Endomysial IgA: POSITIVE — AB
IgA/Immunoglobulin A, Serum: 114 mg/dL (ref 87–352)
Transglutaminase IgA: 68 U/mL — ABNORMAL HIGH (ref 0–3)

## 2022-01-31 ENCOUNTER — Other Ambulatory Visit (INDEPENDENT_AMBULATORY_CARE_PROVIDER_SITE_OTHER): Payer: Self-pay | Admitting: Gastroenterology

## 2022-02-12 ENCOUNTER — Encounter (INDEPENDENT_AMBULATORY_CARE_PROVIDER_SITE_OTHER): Payer: Self-pay | Admitting: Gastroenterology

## 2022-02-14 NOTE — Telephone Encounter (Signed)
I spoke with the patient whom wanted to know if TIF could be done on 02/16/2022 during EGD. I spoke with Dr. Jenetta Downer and he says no she will have to have other testing prior to having the TIF. Patient made aware Doctor will discuss this with the patient on 02/16/2022, and I have also mail a TIF pamphlet to her to review.

## 2022-02-16 ENCOUNTER — Ambulatory Visit (HOSPITAL_COMMUNITY): Payer: PPO | Admitting: Certified Registered Nurse Anesthetist

## 2022-02-16 ENCOUNTER — Ambulatory Visit (HOSPITAL_COMMUNITY)
Admission: RE | Admit: 2022-02-16 | Discharge: 2022-02-16 | Disposition: A | Discharge status: Home / Self Care | Payer: PPO | Attending: Gastroenterology | Admitting: Gastroenterology

## 2022-02-16 ENCOUNTER — Encounter (HOSPITAL_COMMUNITY): Payer: Self-pay | Admitting: Gastroenterology

## 2022-02-16 ENCOUNTER — Ambulatory Visit (HOSPITAL_BASED_OUTPATIENT_CLINIC_OR_DEPARTMENT_OTHER): Payer: PPO | Admitting: Certified Registered Nurse Anesthetist

## 2022-02-16 ENCOUNTER — Other Ambulatory Visit: Payer: Self-pay

## 2022-02-16 ENCOUNTER — Encounter (HOSPITAL_COMMUNITY)
Admission: RE | Disposition: A | Discharge status: Home / Self Care | Payer: Self-pay | Source: Home / Self Care | Attending: Gastroenterology

## 2022-02-16 DIAGNOSIS — K2980 Duodenitis without bleeding: Secondary | ICD-10-CM | POA: Diagnosis not present

## 2022-02-16 DIAGNOSIS — K449 Diaphragmatic hernia without obstruction or gangrene: Secondary | ICD-10-CM

## 2022-02-16 DIAGNOSIS — Z8719 Personal history of other diseases of the digestive system: Secondary | ICD-10-CM | POA: Diagnosis not present

## 2022-02-16 DIAGNOSIS — R109 Unspecified abdominal pain: Secondary | ICD-10-CM | POA: Diagnosis not present

## 2022-02-16 DIAGNOSIS — K2970 Gastritis, unspecified, without bleeding: Secondary | ICD-10-CM

## 2022-02-16 DIAGNOSIS — R197 Diarrhea, unspecified: Secondary | ICD-10-CM | POA: Insufficient documentation

## 2022-02-16 DIAGNOSIS — K3189 Other diseases of stomach and duodenum: Secondary | ICD-10-CM

## 2022-02-16 DIAGNOSIS — R1310 Dysphagia, unspecified: Secondary | ICD-10-CM | POA: Diagnosis not present

## 2022-02-16 DIAGNOSIS — R1319 Other dysphagia: Secondary | ICD-10-CM

## 2022-02-16 DIAGNOSIS — K909 Intestinal malabsorption, unspecified: Secondary | ICD-10-CM

## 2022-02-16 DIAGNOSIS — K2100 Gastro-esophageal reflux disease with esophagitis, without bleeding: Secondary | ICD-10-CM | POA: Insufficient documentation

## 2022-02-16 DIAGNOSIS — K900 Celiac disease: Secondary | ICD-10-CM | POA: Insufficient documentation

## 2022-02-16 DIAGNOSIS — R140 Abdominal distension (gaseous): Secondary | ICD-10-CM | POA: Insufficient documentation

## 2022-02-16 DIAGNOSIS — K219 Gastro-esophageal reflux disease without esophagitis: Secondary | ICD-10-CM | POA: Diagnosis not present

## 2022-02-16 DIAGNOSIS — R6881 Early satiety: Secondary | ICD-10-CM | POA: Diagnosis not present

## 2022-02-16 DIAGNOSIS — R110 Nausea: Secondary | ICD-10-CM | POA: Diagnosis not present

## 2022-02-16 HISTORY — PX: BIOPSY: SHX5522

## 2022-02-16 HISTORY — PX: ESOPHAGOGASTRODUODENOSCOPY (EGD) WITH PROPOFOL: SHX5813

## 2022-02-16 HISTORY — PX: SAVORY DILATION: SHX5439

## 2022-02-16 HISTORY — DX: Celiac disease: K90.0

## 2022-02-16 SURGERY — ESOPHAGOGASTRODUODENOSCOPY (EGD) WITH PROPOFOL
Anesthesia: General

## 2022-02-16 MED ORDER — LACTATED RINGERS IV SOLN
INTRAVENOUS | Status: DC
  Administered 2022-02-16: 1000 mL via INTRAVENOUS

## 2022-02-16 MED ORDER — LIDOCAINE HCL (CARDIAC) PF 100 MG/5ML IV SOSY
PREFILLED_SYRINGE | INTRAVENOUS | Status: DC | PRN
  Administered 2022-02-16: 50 mg via INTRAVENOUS

## 2022-02-16 MED ORDER — PROPOFOL 10 MG/ML IV BOLUS
INTRAVENOUS | Status: DC | PRN
  Administered 2022-02-16: 20 mg via INTRAVENOUS
  Administered 2022-02-16: 50 mg via INTRAVENOUS
  Administered 2022-02-16: 100 mg via INTRAVENOUS

## 2022-02-16 MED ORDER — FAMOTIDINE 40 MG PO TABS: 40.0000 mg | ORAL_TABLET | Freq: Every day | ORAL | 3 refills | Status: DC

## 2022-02-16 NOTE — Op Note (Signed)
[facility] Patient Name: Rhonda Sullivan Procedure Date: 02/16/2022 12:50 PM MRN: [id number] Date of Birth: Aug 24, 1955 Attending MD: Maylon Peppers , , [id number] CSN: [id number] Age: 66 Admit Type: Outpatient Procedure:                Upper GI endoscopy Indications:              Abdominal pain, Dysphagia, Follow-up of                            gastro-esophageal reflux disease, Follow-up of                            celiac disease, Abdominal bloating, Diarrhea Providers:                Maylon Peppers, Lambert Mody, Aram Candela Referring MD:              Medicines:                Monitored Anesthesia Care Complications:            No immediate complications. Estimated Blood Loss:     Estimated blood loss: none. Procedure:                Pre-Anesthesia Assessment:                           - Prior to the procedure, a History and Physical                            was performed, and patient medications, allergies                            and sensitivities were reviewed. The patient's                            tolerance of previous anesthesia was reviewed.                           - The risks and benefits of the procedure and the                            sedation options and risks were discussed with the                            patient. All questions were answered and informed                            consent was obtained.                           - ASA Grade Assessment: III - A patient with severe                            systemic disease.                           After obtaining informed consent, the endoscope  was                            passed under direct vision. Throughout the                            procedure, the patient's blood pressure, pulse, and                            oxygen saturations were monitored continuously. The                            [id number]-H190 ([id number]) scope was introduced through the                            mouth, and  advanced to the second part of duodenum.                            The upper GI endoscopy was accomplished without                            difficulty. The patient tolerated the procedure                            well. Scope In: 1:15:41 PM Scope Out: 1:23:31 PM Total Procedure Duration: 0 hours 7 minutes 50 seconds  Findings:      No endoscopic abnormality was evident in the esophagus to explain the       patient's complaint of dysphagia. It was decided, however, to proceed       with dilation of the entire esophagus. A guidewire was placed and the       scope was withdrawn. Dilation was performed with a Savary dilator with       no resistance at 18 mm. The GE junction was examined following endoscope       reinsertion and showed mild mucosal disruption.      A 1 cm hiatal hernia was present.      The gastroesophageal flap valve was visualized endoscopically and       classified as Hill Grade II (fold present, opens with respiration).      Localized mild inflammation characterized by congestion (edema) and       erythema was found in the gastric body. Biopsies were taken with a cold       forceps for Helicobacter pylori testing.      Patchy mildly erythematous mucosa without active bleeding and with no       stigmata of bleeding was found in the duodenal bulb. Biopsies from bulb       and 2nd portion were taken with a cold forceps for histology. Impression:               - No endoscopic esophageal abnormality to explain                            patient's dysphagia. Esophagus dilated. Dilated.                           - 1 cm  hiatal hernia.                           - Gastroesophageal flap valve classified as Hill                            Grade II (fold present, opens with respiration).                           - Gastritis. Biopsied.                           - Erythematous duodenopathy. Biopsied. Moderate Sedation:      Per Anesthesia Care Recommendation:           -  Discharge patient to home (ambulatory).                           - Resume previous diet - follow with dietitian                            regarding directions to follow a strict gluten free                            diet and diet for alpha gal.                           - Await pathology results.                           - Start famotidine 40 mg daily. Procedure Code(s):        --- Professional ---                           [id number], Esophagogastroduodenoscopy, flexible,                            transoral; with insertion of guide wire followed by                            passage of dilator(s) through esophagus over guide                            wire                           95093, 70, Esophagogastroduodenoscopy, flexible,                            transoral; with biopsy, single or multiple Diagnosis Code(s):        --- Professional ---                           R13.10, Dysphagia, unspecified                           K44.9, Diaphragmatic hernia without obstruction or  gangrene                           K29.70, Gastritis, unspecified, without bleeding                           K31.89, Other diseases of stomach and duodenum                           R10.9, Unspecified abdominal pain                           K21.9, Gastro-esophageal reflux disease without                            esophagitis                           K90.0, Celiac disease                           R14.0, Abdominal distension (gaseous)                           R19.7, Diarrhea, unspecified CPT copyright 2022 American [employer] Association. All rights reserved. The codes documented in this report are preliminary and upon coder review may  be revised to meet current compliance requirements. Maylon Peppers, MD Maylon Peppers,  02/16/2022 1:32:19 PM This report has been signed electronically. Number of Addenda: 0

## 2022-02-16 NOTE — Transfer of Care (Signed)
Immediate Anesthesia Transfer of Care Note  Patient: Rhonda Sullivan  Procedure(s) Performed: ESOPHAGOGASTRODUODENOSCOPY (EGD) WITH PROPOFOL BIOPSY SAVORY DILATION  Patient Location: Endoscopy Unit  Anesthesia Type:General  Level of Consciousness: awake  Airway & Oxygen Therapy: Patient Spontanous Breathing  Post-op Assessment: Report given to RN and Post -op Vital signs reviewed and stable  Post vital signs: Reviewed and stable  Last Vitals:  Vitals Value Taken Time  BP 88/45 02/16/22 1326  Temp 36.4 C 02/16/22 1326  Pulse 72 02/16/22 1326  Resp 13 02/16/22 1326  SpO2 93 % 02/16/22 1326    Last Pain:  Vitals:   02/16/22 1326  TempSrc: Oral  PainSc: 0-No pain      Patients Stated Pain Goal: 5 (08/26/89 0289)  Complications: No notable events documented.

## 2022-02-16 NOTE — Anesthesia Preprocedure Evaluation (Signed)
Anesthesia Evaluation  Patient identified by MRN, date of birth, ID band Patient awake    Reviewed: Allergy & Precautions, H&P , NPO status , Patient's Chart, lab work & pertinent test results, reviewed documented beta blocker date and time   Airway Mallampati: II  TM Distance: >3 FB Neck ROM: full    Dental no notable dental hx.    Pulmonary neg pulmonary ROS   Pulmonary exam normal breath sounds clear to auscultation       Cardiovascular Exercise Tolerance: Good negative cardio ROS  Rhythm:regular Rate:Normal     Neuro/Psych negative neurological ROS  negative psych ROS   GI/Hepatic negative GI ROS, Neg liver ROS,GERD  ,,  Endo/Other  negative endocrine ROS    Renal/GU negative Renal ROS  negative genitourinary   Musculoskeletal   Abdominal   Peds  Hematology negative hematology ROS (+)   Anesthesia Other Findings   Reproductive/Obstetrics negative OB ROS                             Anesthesia Physical Anesthesia Plan  ASA: 2  Anesthesia Plan: General   Post-op Pain Management:    Induction:   PONV Risk Score and Plan:   Airway Management Planned:   Additional Equipment:   Intra-op Plan:   Post-operative Plan:   Informed Consent: I have reviewed the patients History and Physical, chart, labs and discussed the procedure including the risks, benefits and alternatives for the proposed anesthesia with the patient or authorized representative who has indicated his/her understanding and acceptance.     Dental Advisory Given  Plan Discussed with: CRNA  Anesthesia Plan Comments:        Anesthesia Quick Evaluation

## 2022-02-16 NOTE — Anesthesia Postprocedure Evaluation (Signed)
Anesthesia Post Note  Patient: Rhonda Sullivan  Procedure(s) Performed: ESOPHAGOGASTRODUODENOSCOPY (EGD) WITH PROPOFOL BIOPSY SAVORY DILATION  Patient location during evaluation: Phase II Anesthesia Type: General Level of consciousness: awake and alert Pain management: pain level controlled Vital Signs Assessment: post-procedure vital signs reviewed and stable Respiratory status: spontaneous breathing, nonlabored ventilation, respiratory function stable and patient connected to nasal cannula oxygen Cardiovascular status: blood pressure returned to baseline and stable Postop Assessment: no apparent nausea or vomiting Anesthetic complications: no   No notable events documented.   Last Vitals:  Vitals:   02/16/22 1326 02/16/22 1328  BP: (!) 88/45 99/60  Pulse: 72 75  Resp: 13 16  Temp: (!) 36.4 C   SpO2: 93% 94%    Last Pain:  Vitals:   02/16/22 1326  TempSrc: Oral  PainSc: 0-No pain                 Brent Noto Clyde Canterbury

## 2022-02-16 NOTE — Discharge Instructions (Signed)
You are being discharged to home.  Resume your previous diet - follow with dietitian regarding directions to follow a strict gluten free diet and diet for alpha gal. We are waiting for your pathology results.  Start famotidine 40 mg daily.

## 2022-02-16 NOTE — Interval H&P Note (Signed)
History and Physical Interval Note:  02/16/2022 12:13 PM  Rhonda Sullivan  has presented today for surgery, with the diagnosis of Nausea Early Satiety hx of Celiac.  The various methods of treatment have been discussed with the patient and family. After consideration of risks, benefits and other options for treatment, the patient has consented to  Procedure(s) with comments: ESOPHAGOGASTRODUODENOSCOPY (EGD) WITH PROPOFOL (N/A) - 130 ASA 2 as a surgical intervention.  The patient's history has been reviewed, patient examined, no change in status, stable for surgery.  I have reviewed the patient's chart and labs.  Questions were answered to the patient's satisfaction.     Maylon Peppers Mayorga

## 2022-02-18 LAB — SURGICAL PATHOLOGY

## 2022-02-22 ENCOUNTER — Encounter (HOSPITAL_COMMUNITY): Payer: Self-pay | Admitting: Gastroenterology

## 2022-03-17 ENCOUNTER — Encounter: Payer: PPO | Admitting: Nutrition

## 2022-04-01 ENCOUNTER — Encounter (INDEPENDENT_AMBULATORY_CARE_PROVIDER_SITE_OTHER): Payer: Self-pay | Admitting: Gastroenterology

## 2022-04-26 DIAGNOSIS — Z91018 Allergy to other foods: Secondary | ICD-10-CM | POA: Diagnosis not present

## 2022-04-26 DIAGNOSIS — Z6828 Body mass index (BMI) 28.0-28.9, adult: Secondary | ICD-10-CM | POA: Diagnosis not present

## 2022-04-26 DIAGNOSIS — R030 Elevated blood-pressure reading, without diagnosis of hypertension: Secondary | ICD-10-CM | POA: Diagnosis not present

## 2022-04-26 DIAGNOSIS — R739 Hyperglycemia, unspecified: Secondary | ICD-10-CM | POA: Diagnosis not present

## 2022-04-26 DIAGNOSIS — K900 Celiac disease: Secondary | ICD-10-CM | POA: Diagnosis not present

## 2022-04-26 DIAGNOSIS — R300 Dysuria: Secondary | ICD-10-CM | POA: Diagnosis not present

## 2022-05-03 ENCOUNTER — Ambulatory Visit (INDEPENDENT_AMBULATORY_CARE_PROVIDER_SITE_OTHER): Payer: PPO | Admitting: Gastroenterology

## 2022-05-17 DIAGNOSIS — E119 Type 2 diabetes mellitus without complications: Secondary | ICD-10-CM | POA: Diagnosis not present

## 2022-05-17 DIAGNOSIS — I10 Essential (primary) hypertension: Secondary | ICD-10-CM | POA: Diagnosis not present

## 2022-05-17 DIAGNOSIS — I739 Peripheral vascular disease, unspecified: Secondary | ICD-10-CM | POA: Diagnosis not present

## 2022-05-17 DIAGNOSIS — Z952 Presence of prosthetic heart valve: Secondary | ICD-10-CM | POA: Diagnosis not present

## 2022-05-17 DIAGNOSIS — I351 Nonrheumatic aortic (valve) insufficiency: Secondary | ICD-10-CM | POA: Diagnosis not present

## 2022-06-14 ENCOUNTER — Ambulatory Visit (INDEPENDENT_AMBULATORY_CARE_PROVIDER_SITE_OTHER): Payer: PPO | Admitting: Gastroenterology

## 2022-07-26 DIAGNOSIS — Z91018 Allergy to other foods: Secondary | ICD-10-CM | POA: Diagnosis not present

## 2022-07-26 DIAGNOSIS — Z1389 Encounter for screening for other disorder: Secondary | ICD-10-CM | POA: Diagnosis not present

## 2022-07-26 DIAGNOSIS — E1165 Type 2 diabetes mellitus with hyperglycemia: Secondary | ICD-10-CM | POA: Diagnosis not present

## 2022-07-26 DIAGNOSIS — R030 Elevated blood-pressure reading, without diagnosis of hypertension: Secondary | ICD-10-CM | POA: Diagnosis not present

## 2022-07-26 DIAGNOSIS — Z6828 Body mass index (BMI) 28.0-28.9, adult: Secondary | ICD-10-CM | POA: Diagnosis not present

## 2022-07-26 DIAGNOSIS — K900 Celiac disease: Secondary | ICD-10-CM | POA: Diagnosis not present

## 2022-07-26 DIAGNOSIS — R300 Dysuria: Secondary | ICD-10-CM | POA: Diagnosis not present

## 2022-07-26 DIAGNOSIS — Z1331 Encounter for screening for depression: Secondary | ICD-10-CM | POA: Diagnosis not present

## 2022-07-28 ENCOUNTER — Encounter (INDEPENDENT_AMBULATORY_CARE_PROVIDER_SITE_OTHER): Payer: Self-pay | Admitting: Gastroenterology

## 2022-07-28 ENCOUNTER — Ambulatory Visit (INDEPENDENT_AMBULATORY_CARE_PROVIDER_SITE_OTHER): Payer: Medicare HMO | Admitting: Gastroenterology

## 2022-07-28 VITALS — BP 129/69 | HR 102 | Temp 97.5°F | Ht 62.0 in | Wt 162.7 lb

## 2022-07-28 DIAGNOSIS — Z91018 Allergy to other foods: Secondary | ICD-10-CM | POA: Diagnosis not present

## 2022-07-28 DIAGNOSIS — K900 Celiac disease: Secondary | ICD-10-CM | POA: Diagnosis not present

## 2022-07-28 NOTE — Progress Notes (Signed)
Katrinka Blazing, M.D. Gastroenterology & Hepatology [facility] Gastroenterology [address] [place], [place] [address]  Primary Care Physician: Assunta Found, MD [address] [place] [address]  I will communicate my assessment and recommendations to the referring MD via EMR.  Problems: Chronic diarrhea, possibly related to celiac disease History of alpha gal Fatty liver  History of Present Illness: Rhonda Sullivan is a 67 y.o. female with past medical history of gout, angioedema, celiac disease, alpha gal who presents for follow up of celiac disease and alpha gal allergy.  The patient was last seen on 01/24/2022. At that time, the patient was referred to dietitian and was scheduled for EGD with findings described below. Patient was referred to dietitian due to abnormal serologies, but she did not see the dietitian.  Patient reports feeling much better since the last time she saw [place] in the clinic. She states very rarely she has some diarrhea. States her stools are usually formed. Stools sometimes are sometimes dark but not black. She has not been able to stick to gluten free diet as she had a very limited diet with her allergies and low sugar. She can tolerate some beef or pork intermittently. She reported feeling improvement of her symptoms after famotidine was stopped. She is taking some "natural reflux medicine", which has controlled her reflux episodes. The patient denies having any nausea, vomiting, fever, chills, hematochezia, melena, hematemesis, abdominal distention, abdominal pain, diarrhea, jaundice, pruritus or weight loss.  Last Colonoscopy:2016 internal hemorrhoids, otherwise normal exam Last Endoscopy: 02/16/2022 Dilation performed for dysphagia with a savory up to 18 mm, there was presence of mild mucosal disruption, 1 cm hiatal hernia.  Stomach showed some mild erythema and congestion, biopsies were negative for any dysplasia or H.  pylori.  Patchy erythema in the duodenum.Biopsied but negative for celiac disease as there was presence of normal villous architecture.    Notably, he had a previous biopsy in 2020 with Dr.  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 and mild villous architectural changes  Past Medical History: Past Medical History:  Diagnosis Date   Angio-edema    Celiac disease    GERD (gastroesophageal reflux disease)     Past Surgical History: Past Surgical History:  Procedure Laterality Date   ABDOMINAL HYSTERECTOMY     BIOPSY  12/06/2018   Procedure: BIOPSY;  Surgeon: Malissa Hippo, MD;  Location: [facility];  Service: Endoscopy;;  Duodenal Biopsies fopr celiac disease    BIOPSY  02/16/2022   Procedure: BIOPSY;  Surgeon: Dolores Frame, MD;  Location: [facility];  Service: Gastroenterology;;   ESOPHAGOGASTRODUODENOSCOPY N/A 12/06/2018   Procedure: ESOPHAGOGASTRODUODENOSCOPY (EGD);  Surgeon: Malissa Hippo, MD;  Location: [facility];  Service: Endoscopy;  Laterality: N/A;  3:00   ESOPHAGOGASTRODUODENOSCOPY (EGD) WITH PROPOFOL N/A 02/16/2022   Procedure: ESOPHAGOGASTRODUODENOSCOPY (EGD) WITH PROPOFOL;  Surgeon: Dolores Frame, MD;  Location: [facility];  Service: Gastroenterology;  Laterality: N/A;  130 ASA 2   POLYPECTOMY  12/06/2018   Procedure: POLYPECTOMY;  Surgeon: Malissa Hippo, MD;  Location: [facility];  Service: Endoscopy;;  gastric polyps biopsy    SAVORY DILATION  02/16/2022   Procedure: SAVORY DILATION;  Surgeon: Dolores Frame, MD;  Location: [facility];  Service: Gastroenterology;;    Family History: Family History  Problem Relation Age of Onset   Allergic rhinitis Mother    Asthma Neg Hx    Eczema Neg Hx  Urticaria Neg Hx     Social History: Social History   Tobacco Use  Smoking Status Never   Passive exposure: Past  Smokeless Tobacco Never   Social History   Substance and  Sexual Activity  Alcohol Use No   Social History   Substance and Sexual Activity  Drug Use Never    Allergies: Allergies  Allergen Reactions   Alpha-Gal Other (See Comments)    When patient eats beef & pork - stomach pain - would have to go to bed. Confirmed by allergist after Tick bite.   Neosporin [Bacitracin-Polymyxin B] Dermatitis   Other Other (See Comments)    Metal- body rejects it     Medications: Current Outpatient Medications  Medication Sig Dispense Refill   ACCU-CHEK GUIDE test strip USE TO TEST TWICE A DAY     acetaminophen (TYLENOL) 325 MG tablet Take 650 mg by mouth every 6 (six) hours as needed. As needed     Alpha-Lipoic Acid 300 MG CAPS Take 300 mg by mouth 2 (two) times daily.     Bioflavonoid Products (ESTER-C PO) Take 1,000 mg by mouth daily.     calcium elemental as carbonate (TUMS ULTRA 1000) 400 MG chewable tablet Chew 1,000-2,000 mg by mouth 3 (three) times daily as needed for heartburn.     Chromium Picolinate 1000 MCG TABS Take 1,000 mg by mouth daily at 12 noon.     Magnesium 100 MG CAPS Take 100 mg by mouth 2 (two) times daily.     melatonin 5 MG TABS Take 10 mg by mouth daily as needed (sleep).     Multiple Vitamin (MULTIVITAMIN WITH MINERALS) TABS tablet Take 1 tablet by mouth daily.     OVER THE COUNTER MEDICATION Take 240 mg by mouth daily. R-lipoic acid     OVER THE COUNTER MEDICATION Take 1 tablet by mouth 2 (two) times daily. Reflux health     OVER THE COUNTER MEDICATION Calcium daily     Probiotic Product (PROBIOTIC PO) Take 1 capsule by mouth daily.     TURMERIC CURCUMIN PO Take 1 capsule by mouth daily. 500 mcg     No current facility-administered medications for this visit.    Review of Systems: GENERAL: negative for malaise, night sweats HEENT: No changes in hearing or vision, no nose bleeds or other nasal problems. NECK: Negative for lumps, goiter, pain and significant neck swelling RESPIRATORY: Negative for cough,  wheezing CARDIOVASCULAR: Negative for chest pain, leg swelling, palpitations, orthopnea GI: SEE HPI MUSCULOSKELETAL: Negative for joint pain or swelling, back pain, and muscle pain. SKIN: Negative for lesions, rash PSYCH: Negative for sleep disturbance, mood disorder and recent psychosocial stressors. HEMATOLOGY Negative for prolonged bleeding, bruising easily, and swollen nodes. ENDOCRINE: Negative for cold or heat intolerance, polyuria, polydipsia and goiter. NEURO: negative for tremor, gait imbalance, syncope and seizures. The remainder of the review of systems is noncontributory.   Physical Exam: BP 129/69 (BP Location: Left Arm, Patient Position: Sitting, Cuff Size: Normal)   Pulse (!) 102   Temp (!) 97.5 F (36.4 C) (Temporal)   Ht  (1.575 m)   Wt 162 lb 11.2 oz (73.8 kg)   BMI 29.76 kg/m  GENERAL: The patient is AO x3, in no acute distress. HEENT: Head is normocephalic and atraumatic. EOMI are intact. Mouth is well hydrated and without lesions. NECK: Supple. No masses LUNGS: Clear to auscultation. No presence of rhonchi/wheezing/rales. Adequate chest expansion HEART: RRR, normal s1 and s2. ABDOMEN: Soft, nontender, no  guarding, no peritoneal signs, and nondistended. BS +. No masses. EXTREMITIES: Without any cyanosis, clubbing, rash, lesions or edema. NEUROLOGIC: AOx3, no focal motor deficit. SKIN: no jaundice, no rashes  Imaging/Labs: as above  I personally reviewed and interpreted the available labs, imaging and endoscopic files.  Impression and Plan: Rhonda Sullivan is a 67 y.o. female with past medical history of gout, angioedema, celiac disease, alpha gal who presents for follow up of celiac disease and alpha gal allergy.  Patient has recommended some dietary changes on her own and has had significant improvement of her symptoms.  In fact, she had improvement of her diarrhea while avoiding famotidine.  We discussed that ideally, she should abstain from consuming  any gluten given her history of celiac disease but she recognizes that it is difficult for her to completely avoid this.  She will try to implement these changes as much as possible.  Fortunately, during her last esophagogastroduodenospy there was no active celiac disease histologically.  She is not currently interested in seeing a dietitian.  -Try to adhere to a gluten free diet as much as possible -Patient to let us know if interested in seeing a dietitian  All questions were answered.      Katrinka Blazing, MD Gastroenterology and Hepatology [facility]enter[facility]

## 2022-07-28 NOTE — Patient Instructions (Signed)
Try to adhere to a gluten free diet as much as possible Please let us know if interested in seeing a dietitian

## 2022-08-02 DIAGNOSIS — R011 Cardiac murmur, unspecified: Secondary | ICD-10-CM | POA: Diagnosis not present

## 2022-08-02 DIAGNOSIS — G35 Multiple sclerosis: Secondary | ICD-10-CM | POA: Diagnosis not present

## 2022-08-11 DIAGNOSIS — R030 Elevated blood-pressure reading, without diagnosis of hypertension: Secondary | ICD-10-CM | POA: Diagnosis not present

## 2022-08-11 DIAGNOSIS — Z6828 Body mass index (BMI) 28.0-28.9, adult: Secondary | ICD-10-CM | POA: Diagnosis not present

## 2022-08-11 DIAGNOSIS — E1165 Type 2 diabetes mellitus with hyperglycemia: Secondary | ICD-10-CM | POA: Diagnosis not present

## 2022-08-11 DIAGNOSIS — R300 Dysuria: Secondary | ICD-10-CM | POA: Diagnosis not present

## 2022-08-23 DIAGNOSIS — I739 Peripheral vascular disease, unspecified: Secondary | ICD-10-CM | POA: Diagnosis not present

## 2022-08-23 DIAGNOSIS — Z5181 Encounter for therapeutic drug level monitoring: Secondary | ICD-10-CM | POA: Diagnosis not present

## 2022-08-23 DIAGNOSIS — I350 Nonrheumatic aortic (valve) stenosis: Secondary | ICD-10-CM | POA: Diagnosis not present

## 2022-08-23 DIAGNOSIS — Z952 Presence of prosthetic heart valve: Secondary | ICD-10-CM | POA: Diagnosis not present

## 2022-08-23 DIAGNOSIS — E7800 Pure hypercholesterolemia, unspecified: Secondary | ICD-10-CM | POA: Diagnosis not present

## 2022-08-23 DIAGNOSIS — I6523 Occlusion and stenosis of bilateral carotid arteries: Secondary | ICD-10-CM | POA: Diagnosis not present

## 2022-10-14 DIAGNOSIS — Z6828 Body mass index (BMI) 28.0-28.9, adult: Secondary | ICD-10-CM | POA: Diagnosis not present

## 2022-10-14 DIAGNOSIS — J0100 Acute maxillary sinusitis, unspecified: Secondary | ICD-10-CM | POA: Diagnosis not present

## 2022-10-14 DIAGNOSIS — R030 Elevated blood-pressure reading, without diagnosis of hypertension: Secondary | ICD-10-CM | POA: Diagnosis not present

## 2022-11-17 DIAGNOSIS — Z13220 Encounter for screening for lipoid disorders: Secondary | ICD-10-CM | POA: Diagnosis not present

## 2022-11-17 DIAGNOSIS — R739 Hyperglycemia, unspecified: Secondary | ICD-10-CM | POA: Diagnosis not present

## 2022-11-17 DIAGNOSIS — Z1329 Encounter for screening for other suspected endocrine disorder: Secondary | ICD-10-CM | POA: Diagnosis not present

## 2022-11-17 DIAGNOSIS — E1165 Type 2 diabetes mellitus with hyperglycemia: Secondary | ICD-10-CM | POA: Diagnosis not present

## 2022-11-24 ENCOUNTER — Other Ambulatory Visit (HOSPITAL_COMMUNITY): Payer: Self-pay | Admitting: Family Medicine

## 2022-11-24 DIAGNOSIS — E7849 Other hyperlipidemia: Secondary | ICD-10-CM | POA: Diagnosis not present

## 2022-11-24 DIAGNOSIS — Z6827 Body mass index (BMI) 27.0-27.9, adult: Secondary | ICD-10-CM | POA: Diagnosis not present

## 2022-11-24 DIAGNOSIS — Z1231 Encounter for screening mammogram for malignant neoplasm of breast: Secondary | ICD-10-CM

## 2022-11-24 DIAGNOSIS — E1165 Type 2 diabetes mellitus with hyperglycemia: Secondary | ICD-10-CM | POA: Diagnosis not present

## 2022-11-24 DIAGNOSIS — R030 Elevated blood-pressure reading, without diagnosis of hypertension: Secondary | ICD-10-CM | POA: Diagnosis not present

## 2022-11-24 DIAGNOSIS — K900 Celiac disease: Secondary | ICD-10-CM | POA: Diagnosis not present

## 2022-11-24 DIAGNOSIS — T466X5A Adverse effect of antihyperlipidemic and antiarteriosclerotic drugs, initial encounter: Secondary | ICD-10-CM | POA: Diagnosis not present

## 2022-11-24 DIAGNOSIS — M609 Myositis, unspecified: Secondary | ICD-10-CM | POA: Diagnosis not present

## 2022-11-24 DIAGNOSIS — Z91018 Allergy to other foods: Secondary | ICD-10-CM | POA: Diagnosis not present

## 2022-12-01 ENCOUNTER — Ambulatory Visit (HOSPITAL_COMMUNITY)
Admission: RE | Admit: 2022-12-01 | Discharge: 2022-12-01 | Disposition: A | Discharge status: Home / Self Care | Payer: Medicare HMO | Source: Ambulatory Visit | Attending: Family Medicine | Admitting: Family Medicine

## 2022-12-01 DIAGNOSIS — Z1231 Encounter for screening mammogram for malignant neoplasm of breast: Secondary | ICD-10-CM | POA: Insufficient documentation

## 2022-12-14 DIAGNOSIS — X32XXXD Exposure to sunlight, subsequent encounter: Secondary | ICD-10-CM | POA: Diagnosis not present

## 2022-12-14 DIAGNOSIS — L570 Actinic keratosis: Secondary | ICD-10-CM | POA: Diagnosis not present

## 2023-02-03 DIAGNOSIS — R300 Dysuria: Secondary | ICD-10-CM | POA: Diagnosis not present

## 2023-02-13 DIAGNOSIS — Z6824 Body mass index (BMI) 24.0-24.9, adult: Secondary | ICD-10-CM | POA: Diagnosis not present

## 2023-02-13 DIAGNOSIS — M609 Myositis, unspecified: Secondary | ICD-10-CM | POA: Diagnosis not present

## 2023-02-13 DIAGNOSIS — E1165 Type 2 diabetes mellitus with hyperglycemia: Secondary | ICD-10-CM | POA: Diagnosis not present

## 2023-02-13 DIAGNOSIS — E7849 Other hyperlipidemia: Secondary | ICD-10-CM | POA: Diagnosis not present

## 2023-02-13 DIAGNOSIS — K900 Celiac disease: Secondary | ICD-10-CM | POA: Diagnosis not present

## 2023-02-13 DIAGNOSIS — R030 Elevated blood-pressure reading, without diagnosis of hypertension: Secondary | ICD-10-CM | POA: Diagnosis not present

## 2023-02-13 DIAGNOSIS — Z91018 Allergy to other foods: Secondary | ICD-10-CM | POA: Diagnosis not present

## 2023-02-16 DIAGNOSIS — R69 Illness, unspecified: Secondary | ICD-10-CM | POA: Diagnosis not present

## 2023-03-23 ENCOUNTER — Other Ambulatory Visit (INDEPENDENT_AMBULATORY_CARE_PROVIDER_SITE_OTHER): Payer: Self-pay | Admitting: Gastroenterology

## 2023-05-09 DIAGNOSIS — Z1329 Encounter for screening for other suspected endocrine disorder: Secondary | ICD-10-CM | POA: Diagnosis not present

## 2023-05-09 DIAGNOSIS — E7849 Other hyperlipidemia: Secondary | ICD-10-CM | POA: Diagnosis not present

## 2023-05-09 DIAGNOSIS — E1165 Type 2 diabetes mellitus with hyperglycemia: Secondary | ICD-10-CM | POA: Diagnosis not present

## 2023-05-09 DIAGNOSIS — E782 Mixed hyperlipidemia: Secondary | ICD-10-CM | POA: Diagnosis not present

## 2023-05-16 DIAGNOSIS — K900 Celiac disease: Secondary | ICD-10-CM | POA: Diagnosis not present

## 2023-05-16 DIAGNOSIS — Z6824 Body mass index (BMI) 24.0-24.9, adult: Secondary | ICD-10-CM | POA: Diagnosis not present

## 2023-05-16 DIAGNOSIS — Z91018 Allergy to other foods: Secondary | ICD-10-CM | POA: Diagnosis not present

## 2023-05-16 DIAGNOSIS — E1165 Type 2 diabetes mellitus with hyperglycemia: Secondary | ICD-10-CM | POA: Diagnosis not present

## 2023-05-16 DIAGNOSIS — E782 Mixed hyperlipidemia: Secondary | ICD-10-CM | POA: Diagnosis not present

## 2023-05-19 DIAGNOSIS — H40003 Preglaucoma, unspecified, bilateral: Secondary | ICD-10-CM | POA: Diagnosis not present

## 2023-05-19 DIAGNOSIS — E119 Type 2 diabetes mellitus without complications: Secondary | ICD-10-CM | POA: Diagnosis not present

## 2023-05-19 DIAGNOSIS — H2513 Age-related nuclear cataract, bilateral: Secondary | ICD-10-CM | POA: Diagnosis not present

## 2023-05-19 DIAGNOSIS — H43811 Vitreous degeneration, right eye: Secondary | ICD-10-CM | POA: Diagnosis not present

## 2023-05-19 DIAGNOSIS — H5203 Hypermetropia, bilateral: Secondary | ICD-10-CM | POA: Diagnosis not present

## 2023-07-03 DIAGNOSIS — R300 Dysuria: Secondary | ICD-10-CM | POA: Diagnosis not present

## 2023-07-31 ENCOUNTER — Ambulatory Visit (INDEPENDENT_AMBULATORY_CARE_PROVIDER_SITE_OTHER): Payer: PPO | Admitting: Gastroenterology

## 2023-11-07 DIAGNOSIS — Z1389 Encounter for screening for other disorder: Secondary | ICD-10-CM | POA: Diagnosis not present

## 2023-11-07 DIAGNOSIS — Z1329 Encounter for screening for other suspected endocrine disorder: Secondary | ICD-10-CM | POA: Diagnosis not present

## 2023-11-07 DIAGNOSIS — E7849 Other hyperlipidemia: Secondary | ICD-10-CM | POA: Diagnosis not present

## 2023-11-07 DIAGNOSIS — E1165 Type 2 diabetes mellitus with hyperglycemia: Secondary | ICD-10-CM | POA: Diagnosis not present

## 2023-11-07 DIAGNOSIS — R739 Hyperglycemia, unspecified: Secondary | ICD-10-CM | POA: Diagnosis not present

## 2023-11-14 DIAGNOSIS — Z91018 Allergy to other foods: Secondary | ICD-10-CM | POA: Diagnosis not present

## 2023-11-14 DIAGNOSIS — Z6824 Body mass index (BMI) 24.0-24.9, adult: Secondary | ICD-10-CM | POA: Diagnosis not present

## 2023-11-14 DIAGNOSIS — E1165 Type 2 diabetes mellitus with hyperglycemia: Secondary | ICD-10-CM | POA: Diagnosis not present

## 2023-11-14 DIAGNOSIS — E782 Mixed hyperlipidemia: Secondary | ICD-10-CM | POA: Diagnosis not present

## 2023-11-14 DIAGNOSIS — K900 Celiac disease: Secondary | ICD-10-CM | POA: Diagnosis not present

## 2023-12-21 DIAGNOSIS — Z6825 Body mass index (BMI) 25.0-25.9, adult: Secondary | ICD-10-CM | POA: Diagnosis not present

## 2023-12-21 DIAGNOSIS — T23271A Burn of second degree of right wrist, initial encounter: Secondary | ICD-10-CM | POA: Diagnosis not present

## 2023-12-25 DIAGNOSIS — Z6825 Body mass index (BMI) 25.0-25.9, adult: Secondary | ICD-10-CM | POA: Diagnosis not present

## 2023-12-25 DIAGNOSIS — T23271A Burn of second degree of right wrist, initial encounter: Secondary | ICD-10-CM | POA: Diagnosis not present

## 2024-01-17 ENCOUNTER — Encounter (INDEPENDENT_AMBULATORY_CARE_PROVIDER_SITE_OTHER): Payer: Self-pay | Admitting: Gastroenterology

## 2024-02-06 DIAGNOSIS — Z6825 Body mass index (BMI) 25.0-25.9, adult: Secondary | ICD-10-CM | POA: Diagnosis not present

## 2024-02-06 DIAGNOSIS — E1165 Type 2 diabetes mellitus with hyperglycemia: Secondary | ICD-10-CM | POA: Diagnosis not present

## 2024-02-06 DIAGNOSIS — R300 Dysuria: Secondary | ICD-10-CM | POA: Diagnosis not present
